# Patient Record
Sex: Female | Born: 1985 | Race: White | Hispanic: No | Marital: Married | Smoking: Former smoker
Health system: Southern US, Community
[De-identification: ages and names within clinical notes are randomized; demographics above are authoritative.]

## PROBLEM LIST (undated history)

## (undated) DIAGNOSIS — J351 Hypertrophy of tonsils: Secondary | ICD-10-CM

## (undated) HISTORY — PX: MOUTH SURGERY: SHX715

---

## 2011-02-09 ENCOUNTER — Ambulatory Visit: Payer: Self-pay | Admitting: Internal Medicine

## 2016-12-06 ENCOUNTER — Encounter: Payer: Self-pay | Admitting: *Deleted

## 2016-12-07 NOTE — Discharge Instructions (Signed)
T & A INSTRUCTION SHEET - [facility] °[facility] ° °CREIGHTON VAUGHT, MD °PAUL H. JUENGEL, MD  °P. SCOTT BENNETT °CHAPMAN MCQUEEN, MD ° °[address] [place], [place] [address] TEL. [phone] °[address] [place] [address] [phone] ° °INFORMATION SHEET FOR A TONSILLECTOMY AND ADENDOIDECTOMY ° °About Your Tonsils and Adenoids ° The tonsils and adenoids are normal body tissues that are part of our immune system.  They normally help to protect us against diseases that may enter our mouth and nose.  However, sometimes the tonsils and/or adenoids become too large and obstruct our breathing, especially at night. °  ° If either of these things happen it helps to remove the tonsils and adenoids in order to become healthier. The operation to remove the tonsils and adenoids is called a tonsillectomy and adenoidectomy. ° °The Location of Your Tonsils and Adenoids ° The tonsils are located in the back of the throat on both side and sit in a cradle of muscles. The adenoids are located in the roof of the mouth, behind the nose, and closely associated with the opening of the Eustachian tube to the ear. ° °Surgery on Tonsils and Adenoids ° A tonsillectomy and adenoidectomy is a short operation which takes about thirty minutes.  This includes being put to sleep and being awakened.  Tonsillectomies and adenoidectomies are performed at [facility] and may require observation period in the recovery room prior to going home. ° °Following the Operation for a Tonsillectomy ° A cautery machine is used to control bleeding.  Bleeding from a tonsillectomy and adenoidectomy is minimal and postoperatively the risk of bleeding is approximately four percent, although this rarely life threatening. ° ° ° °After your tonsillectomy and adenoidectomy post-op care at home: ° °1. Our patients are able to go home the same day.  You may be given prescriptions for pain  medications and antibiotics, if indicated. °2. It is extremely important to remember that fluid intake is of utmost importance after a tonsillectomy.  The amount that you drink must be maintained in the postoperative period.  A good indication of whether a child is getting enough fluid is whether his/her urine output is constant.  As long as children are urinating or wetting their diaper every 6 - 8 hours this is usually enough fluid intake.   °3. Although rare, this is a risk of some bleeding in the first ten days after surgery.  This is usually occurs between day five and nine postoperatively.  This risk of bleeding is approximately four percent.  If you or your child should have any bleeding you should remain calm and notify our office or go directly to the Emergency Room at [facility] where they will contact us. Our doctors are available seven days a week for notification.  We recommend sitting up quietly in a chair, place an ice pack on the front of the neck and spitting out the blood gently until we are able to contact you.  Adults should gargle gently with ice water and this may help stop the bleeding.  If the bleeding does not stop after a short time, i.e. 10 to 15 minutes, or seems to be increasing again, please contact us or go to the hospital.   °4. It is common for the pain to be worse at 5 - 7 days postoperatively.  This occurs because the “scab” is peeling off and the mucous membrane (skin of   the throat) is growing back where the tonsils were.   °5. It is common for a low-grade fever, less than 102, during the first week after a tonsillectomy and adenoidectomy.  It is usually due to not drinking enough liquids, and we suggest your use liquid Tylenol or the pain medicine with Tylenol prescribed in order to keep your temperature below 102.  Please follow the directions on the back of the bottle. °6. Do not take aspirin or any products that contain aspirin such as Bufferin, Anacin,  Ecotrin, aspirin gum, Goodies, BC headache powders, etc., after a T&A because it can promote bleeding.  Please check with our office before administering any other medication that may been prescribed by other doctors during the two week post-operative period. °7. If you happen to look in the mirror or into your child’s mouth you will see white/gray patches on the back of the throat.  This is what a scab looks like in the mouth and is normal after having a T&A.  It will disappear once the tonsil area heals completely. However, it may cause a noticeable odor, and this too will disappear with time.     °8. You or your child may experience ear pain after having a T&A.  This is called referred pain and comes from the throat, but it is felt in the ears.  Ear pain is quite common and expected.  It will usually go away after ten days.  There is usually nothing wrong with the ears, and it is primarily due to the healing area stimulating the nerve to the ear that runs along the side of the throat.  Use either the prescribed pain medicine or Tylenol as needed.  °9. The throat tissues after a tonsillectomy are obviously sensitive.  Smoking around children who have had a tonsillectomy significantly increases the risk of bleeding.  DO NOT SMOKE!  ° °General Anesthesia, Adult, Care After °These instructions provide you with information about caring for yourself after your procedure. Your health care provider may also give you more specific instructions. Your treatment has been planned according to current medical practices, but problems sometimes occur. Call your health care provider if you have any problems or questions after your procedure. °What can I expect after the procedure? °After the procedure, it is common to have: °· Vomiting. °· A sore throat. °· Mental slowness. ° °It is common to feel: °· Nauseous. °· Cold or shivery. °· Sleepy. °· Tired. °· Sore or achy, even in parts of your body where you did not have  surgery. ° °Follow these instructions at home: °For at least 24 hours after the procedure: °· Do not: °? Participate in activities where you could fall or become injured. °? Drive. °? Use heavy machinery. °? Drink alcohol. °? Take sleeping pills or medicines that cause drowsiness. °? Make important decisions or sign legal documents. °? Take care of children on your own. °· Rest. °Eating and drinking °· If you vomit, drink water, juice, or soup when you can drink without vomiting. °· Drink enough fluid to keep your urine clear or pale yellow. °· Make sure you have little or no nausea before eating solid foods. °· Follow the diet recommended by your health care provider. °General instructions °· Have a responsible adult stay with you until you are awake and alert. °· Return to your normal activities as told by your health care provider. Ask your health care provider what activities are safe for you. °· Take over-the-counter and   prescription medicines only as told by your health care provider. °· If you smoke, do not smoke without supervision. °· Keep all follow-up visits as told by your health care provider. This is important. °Contact a health care provider if: °· You continue to have nausea or vomiting at home, and medicines are not helpful. °· You cannot drink fluids or start eating again. °· You cannot urinate after 8-12 hours. °· You develop a skin rash. °· You have fever. °· You have increasing redness at the site of your procedure. °Get help right away if: °· You have difficulty breathing. °· You have chest pain. °· You have unexpected bleeding. °· You feel that you are having a life-threatening or urgent problem. °This information is not intended to replace advice given to you by your health care provider. Make sure you discuss any questions you have with your health care provider. °Document Released: 08/08/2000 Document Revised: 10/05/2015 Document Reviewed: 04/16/2015 °Elsevier Interactive Patient Education  © 2018 [employer] ° °

## 2016-12-13 ENCOUNTER — Ambulatory Visit
Admission: RE | Admit: 2016-12-13 | Discharge: 2016-12-13 | Disposition: A | Discharge status: Home / Self Care | Payer: BC Managed Care – PPO | Source: Ambulatory Visit | Attending: Otolaryngology | Admitting: Otolaryngology

## 2016-12-13 ENCOUNTER — Ambulatory Visit: Payer: BC Managed Care – PPO | Admitting: Anesthesiology

## 2016-12-13 ENCOUNTER — Encounter
Admission: RE | Disposition: A | Discharge status: Home / Self Care | Payer: Self-pay | Source: Ambulatory Visit | Attending: Otolaryngology

## 2016-12-13 DIAGNOSIS — J358 Other chronic diseases of tonsils and adenoids: Secondary | ICD-10-CM | POA: Insufficient documentation

## 2016-12-13 DIAGNOSIS — J3501 Chronic tonsillitis: Secondary | ICD-10-CM | POA: Insufficient documentation

## 2016-12-13 DIAGNOSIS — R0683 Snoring: Secondary | ICD-10-CM | POA: Insufficient documentation

## 2016-12-13 DIAGNOSIS — Z87891 Personal history of nicotine dependence: Secondary | ICD-10-CM | POA: Insufficient documentation

## 2016-12-13 DIAGNOSIS — J351 Hypertrophy of tonsils: Secondary | ICD-10-CM | POA: Diagnosis present

## 2016-12-13 HISTORY — DX: Hypertrophy of tonsils: J35.1

## 2016-12-13 HISTORY — PX: TONSILLECTOMY AND ADENOIDECTOMY: SHX28

## 2016-12-13 SURGERY — TONSILLECTOMY AND ADENOIDECTOMY
Anesthesia: General | Wound class: Clean Contaminated

## 2016-12-13 MED ORDER — PREDNISOLONE SODIUM PHOSPHATE 15 MG/5ML PO SOLN: ORAL | 0 refills | Status: DC

## 2016-12-13 MED ORDER — FENTANYL CITRATE (PF) 100 MCG/2ML IJ SOLN: 25.0000 ug | INTRAMUSCULAR | Status: DC | PRN

## 2016-12-13 MED ORDER — BUPIVACAINE-EPINEPHRINE (PF) 0.25% -1:200000 IJ SOLN
INTRAMUSCULAR | Status: DC | PRN
  Administered 2016-12-13: 3 mL

## 2016-12-13 MED ORDER — ONDANSETRON HCL 4 MG/2ML IJ SOLN
INTRAMUSCULAR | Status: DC | PRN
  Administered 2016-12-13: 4 mg via INTRAVENOUS

## 2016-12-13 MED ORDER — DEXAMETHASONE SODIUM PHOSPHATE 4 MG/ML IJ SOLN
INTRAMUSCULAR | Status: DC | PRN
  Administered 2016-12-13: 8 mg via INTRAVENOUS

## 2016-12-13 MED ORDER — ONDANSETRON HCL 4 MG/2ML IJ SOLN: 4.0000 mg | Freq: Once | INTRAMUSCULAR | Status: DC | PRN

## 2016-12-13 MED ORDER — LACTATED RINGERS IV SOLN
INTRAVENOUS | Status: DC
  Administered 2016-12-13: 09:00:00 via INTRAVENOUS

## 2016-12-13 MED ORDER — LIDOCAINE HCL (CARDIAC) 20 MG/ML IV SOLN
INTRAVENOUS | Status: DC | PRN
  Administered 2016-12-13: 50 mg via INTRAVENOUS

## 2016-12-13 MED ORDER — ACETAMINOPHEN 10 MG/ML IV SOLN
1000.0000 mg | Freq: Once | INTRAVENOUS | Status: DC | PRN
  Administered 2016-12-13: 1000 mg via INTRAVENOUS

## 2016-12-13 MED ORDER — FENTANYL CITRATE (PF) 100 MCG/2ML IJ SOLN
INTRAMUSCULAR | Status: DC | PRN
  Administered 2016-12-13: 100 ug via INTRAVENOUS

## 2016-12-13 MED ORDER — HYDROCODONE-ACETAMINOPHEN 7.5-325 MG/15ML PO SOLN: ORAL | 0 refills | Status: DC

## 2016-12-13 MED ORDER — OXYCODONE HCL 5 MG/5ML PO SOLN
5.0000 mg | Freq: Once | ORAL | Status: AC | PRN
  Administered 2016-12-13: 5 mg via ORAL

## 2016-12-13 MED ORDER — OXYCODONE HCL 5 MG PO TABS: 5.0000 mg | ORAL_TABLET | Freq: Once | ORAL | Status: AC | PRN

## 2016-12-13 MED ORDER — PROPOFOL 10 MG/ML IV BOLUS
INTRAVENOUS | Status: DC | PRN
  Administered 2016-12-13: 200 mg via INTRAVENOUS

## 2016-12-13 MED ORDER — MIDAZOLAM HCL 5 MG/5ML IJ SOLN
INTRAMUSCULAR | Status: DC | PRN
  Administered 2016-12-13: 2 mg via INTRAVENOUS

## 2016-12-13 MED ORDER — LACTATED RINGERS IV SOLN: INTRAVENOUS | Status: DC

## 2016-12-13 SURGICAL SUPPLY — 16 items
CANISTER SUCT 1200ML W/VALVE (MISCELLANEOUS) ×2 IMPLANT
CATH ROBINSON RED A/P 10FR (CATHETERS) ×2 IMPLANT
COAG SUCT 10F 3.5MM HAND CTRL (MISCELLANEOUS) ×2 IMPLANT
DECANTER SPIKE VIAL GLASS SM (MISCELLANEOUS) ×2 IMPLANT
ELECT CAUTERY BLADE TIP 2.5 (TIP) ×2
ELECTRODE CAUTERY BLDE TIP 2.5 (TIP) ×1 IMPLANT
GLOVE BIO SURGEON STRL SZ7.5 (GLOVE) ×2 IMPLANT
KIT ROOM TURNOVER OR (KITS) ×2 IMPLANT
NEEDLE HYPO 25GX1X1/2 BEV (NEEDLE) ×2 IMPLANT
NS IRRIG 500ML POUR BTL (IV SOLUTION) ×2 IMPLANT
PACK TONSIL/ADENOIDS (PACKS) ×2 IMPLANT
PAD GROUND ADULT SPLIT (MISCELLANEOUS) ×2 IMPLANT
PENCIL ELECTRO HAND CTR (MISCELLANEOUS) ×2 IMPLANT
SOL ANTI-FOG 6CC FOG-OUT (MISCELLANEOUS) ×1 IMPLANT
SOL FOG-OUT ANTI-FOG 6CC (MISCELLANEOUS) ×1
SYRINGE 10CC LL (SYRINGE) ×2 IMPLANT

## 2016-12-13 NOTE — H&P (Signed)
History and physical reviewed and will be scanned in later. No change in medical status reported by the patient or family, appears stable for surgery. All questions regarding the procedure answered, and patient (or family if a child) expressed understanding of the procedure.  Robi Mitter S @TODAY@ 

## 2016-12-13 NOTE — Transfer of Care (Signed)
Immediate Anesthesia Transfer of Care Note  Patient: Marie French  Procedure(s) Performed: Procedure(s): TONSILLECTOMY (N/A)  Patient Location: PACU  Anesthesia Type: General  Level of Consciousness: awake, alert  and patient cooperative  Airway and Oxygen Therapy: Patient Spontanous Breathing and Patient connected to supplemental oxygen  Post-op Assessment: Post-op Vital signs reviewed, Patient's Cardiovascular Status Stable, Respiratory Function Stable, Patent Airway and No signs of Nausea or vomiting  Post-op Vital Signs: Reviewed and stable  Complications: No apparent anesthesia complications

## 2016-12-13 NOTE — Anesthesia Procedure Notes (Signed)
Procedure Name: Intubation Date/Time: 12/13/2016 10:12 AM Performed by: Londell Moh Pre-anesthesia Checklist: Patient identified, Emergency Drugs available, Suction available, Patient being monitored and Timeout performed Patient Re-evaluated:Patient Re-evaluated prior to induction Oxygen Delivery Method: Circle system utilized Preoxygenation: Pre-oxygenation with 100% oxygen Induction Type: IV induction Ventilation: Mask ventilation without difficulty Laryngoscope Size: Mac and 3 Grade View: Grade I Tube type: Oral Rae Tube size: 6.5 mm Number of attempts: 1 Placement Confirmation: ETT inserted through vocal cords under direct vision,  positive ETCO2 and breath sounds checked- equal and bilateral Tube secured with: Tape Dental Injury: Teeth and Oropharynx as per pre-operative assessment

## 2016-12-13 NOTE — Op Note (Signed)
12/13/2016  10:33 AM    Marie DolinKimberly A Canales  [id number]   Pre-Op Diagnosis:  TONSIL HYPERTROPHY, TONSILLOLITHIASIS  Post-op Diagnosis: SAME  Procedure: Tonsillectomy  Surgeon:  Sandi MealyBennett, Barbie Croston S., MD  Anesthesia:  General endotracheal  EBL:  Less than 25 cc  Complications:  None  Findings: 3+ cryptic tonsils with tonsillith debris. No significant adenoid tissue in nasopharynx  Procedure: The patient was taken to the Operating Room and placed in the supine position.  After induction of general endotracheal anesthesia, the table was turned 90 degrees and the patient was draped in the usual fashion  with the eyes protected.  A mouth gag was inserted into the oral cavity to open the mouth, and examination of the oropharynx showed the uvula was non-bifid. The palate was palpated, and there was no evidence of submucous cleft. Examination of the nasopharynx showed no obstructing adenoids. The right tonsil was grasped with an Allis clamp and resected from the tonsillar fossa in the usual fashion with the Bovie. The left tonsil was resected in the same fashion. The Bovie was used to obtain hemostasis. Each tonsillar fossa was then carefully injected with 2% lidocaine with epinephrine, 1:200,000, avoiding intravascular injection. The nose and throat were irrigated and suctioned to remove any  blood clot. The mouth gag was  removed with no evidence of active bleeding.  The patient was then returned to the anesthesiologist for awakening, and was taken to the Recovery Room in stable condition.  Cultures:  None.  Specimens:  Tonsils.  Disposition:   PACU to home  Plan: Soft, bland diet and push fluids. Take pain medications, prednisolone and antibiotics as prescribed. No strenuous activity for 2 weeks. Follow-up in 3 weeks.  Sandi MealyBennett, Dymon Summerhill S 12/13/2016 10:33 AM

## 2016-12-13 NOTE — Anesthesia Preprocedure Evaluation (Signed)
Anesthesia Evaluation  Patient identified by MRN, date of birth, ID band Patient awake    Reviewed: Allergy & Precautions, NPO status , Patient's Chart, lab work & pertinent test results  History of Anesthesia Complications Negative for: history of anesthetic complications  Airway Mallampati: I  TM Distance: >3 FB Neck ROM: Full    Dental no notable dental hx. (+) Teeth Intact   Pulmonary former smoker (quit 06/2015),  Snoring    Pulmonary exam normal breath sounds clear to auscultation       Cardiovascular Exercise Tolerance: Good negative cardio ROS Normal cardiovascular exam Rhythm:Regular Rate:Normal     Neuro/Psych negative neurological ROS     GI/Hepatic negative GI ROS,   Endo/Other  negative endocrine ROS  Renal/GU negative Renal ROS     Musculoskeletal   Abdominal   Peds  Hematology negative hematology ROS (+)   Anesthesia Other Findings Tonsillar hypertrophy  Reproductive/Obstetrics                             Anesthesia Physical Anesthesia Plan  ASA: I  Anesthesia Plan: General   Post-op Pain Management:    Induction: Intravenous  PONV Risk Score and Plan: 2 and Ondansetron and Dexamethasone  Airway Management Planned: Oral ETT  Additional Equipment:   Intra-op Plan:   Post-operative Plan: Extubation in OR  Informed Consent: I have reviewed the patients History and Physical, chart, labs and discussed the procedure including the risks, benefits and alternatives for the proposed anesthesia with the patient or authorized representative who has indicated his/her understanding and acceptance.     Plan Discussed with: CRNA  Anesthesia Plan Comments:         Anesthesia Quick Evaluation

## 2016-12-13 NOTE — Anesthesia Postprocedure Evaluation (Signed)
Anesthesia Post Note  Patient: Linden DolinKimberly A Canales  Procedure(s) Performed: Procedure(s) (LRB): TONSILLECTOMY (N/A)  Patient location during evaluation: PACU Anesthesia Type: General Level of consciousness: awake and alert, oriented and patient cooperative Pain management: pain level controlled Vital Signs Assessment: post-procedure vital signs reviewed and stable Respiratory status: spontaneous breathing, nonlabored ventilation and respiratory function stable Cardiovascular status: blood pressure returned to baseline and stable Postop Assessment: adequate PO intake Anesthetic complications: no    Reed BreechAndrea Ameriah Lint

## 2016-12-15 LAB — SURGICAL PATHOLOGY

## 2019-12-11 ENCOUNTER — Encounter: Payer: 59 | Admitting: Advanced Practice Midwife

## 2019-12-26 ENCOUNTER — Other Ambulatory Visit: Payer: Self-pay

## 2019-12-26 ENCOUNTER — Encounter: Payer: Self-pay | Admitting: Obstetrics and Gynecology

## 2019-12-26 ENCOUNTER — Other Ambulatory Visit (HOSPITAL_COMMUNITY)
Admission: RE | Admit: 2019-12-26 | Discharge: 2019-12-26 | Disposition: A | Discharge status: Home / Self Care | Payer: 59 | Source: Ambulatory Visit | Attending: Obstetrics and Gynecology | Admitting: Obstetrics and Gynecology

## 2019-12-26 ENCOUNTER — Ambulatory Visit (INDEPENDENT_AMBULATORY_CARE_PROVIDER_SITE_OTHER): Payer: 59 | Admitting: Obstetrics and Gynecology

## 2019-12-26 VITALS — BP 126/79 | HR 79 | Ht 59.0 in | Wt 112.0 lb

## 2019-12-26 DIAGNOSIS — Z1339 Encounter for screening examination for other mental health and behavioral disorders: Secondary | ICD-10-CM

## 2019-12-26 DIAGNOSIS — Z113 Encounter for screening for infections with a predominantly sexual mode of transmission: Secondary | ICD-10-CM | POA: Insufficient documentation

## 2019-12-26 DIAGNOSIS — Z1331 Encounter for screening for depression: Secondary | ICD-10-CM | POA: Diagnosis not present

## 2019-12-26 DIAGNOSIS — Z124 Encounter for screening for malignant neoplasm of cervix: Secondary | ICD-10-CM | POA: Insufficient documentation

## 2019-12-26 DIAGNOSIS — Z01419 Encounter for gynecological examination (general) (routine) without abnormal findings: Secondary | ICD-10-CM | POA: Diagnosis not present

## 2019-12-26 NOTE — Progress Notes (Signed)
Gynecology Annual Exam  PCP: Patient, No Pcp Per Laddie Aquas, NP at [facility] Complaint  Patient presents with  . Gynecologic Exam    discuss HPV S&S   History of Present Illness:  [name] is a 34 y.o. G0P0000 who LMP was Patient's last menstrual period was 12/17/2019., presents today for her annual examination.  Her menses are regular every 28-30 days, lasting 3 day(s).  Dysmenorrhea none. She occasionally does have intermenstrual spotting.  She is sexually active. She uses combined OCPs.  Last Pap: several years ago  Results were: no abnormalities /neg HPV DNA negative Hx of STDs: HPV, HSV  There is a FH of breast cancer MGM at an older age (?86s-50s). There is no FH of ovarian cancer. The patient does do self-breast exams.  Tobacco use: former smoker, quit about 5 years ago (2016) Alcohol use: social drinker Exercise: very active  The patient wears seatbelts: yes.   The patient reports that domestic violence in her life is absent.   She states that she has genital warts and so does her fiance.  She believes they have been passing it back and forth. These have been present for a couple of years. They both have been to a dermatologist and have had them frozen and have used Aldera.    Past Medical History:  Diagnosis Date  . Hypertrophy of tonsils     Past Surgical History:  Procedure Laterality Date  . MOUTH SURGERY     gum graft. conscious sedation  . TONSILLECTOMY AND ADENOIDECTOMY N/A 12/13/2016   Procedure: TONSILLECTOMY;  Surgeon: Geanie Logan, MD;  [facility];  Service: ENT;  Laterality: N/A;    Prior to Admission medications   Medication Sig Start Date End Date Taking? Authorizing Provider  Norgestim-Eth Estrad Triphasic (TRI-SPRINTEC PO) Take by mouth daily.   Yes [provider]    Allergies  Allergen Reactions  . Penicillins Anaphylaxis   Obstetric History: G0P0000  Social History   Socioeconomic History    . Marital status: Single    Spouse name: Not on file  . Number of children: Not on file  . Years of education: Not on file  . Highest education level: Not on file  Occupational History  . Not on file  Tobacco Use  . Smoking status: Former Smoker    Quit date: 06/17/2015    Years since quitting: 4.5  . Smokeless tobacco: Never Used  Vaping Use  . Vaping Use: Never used  Substance and Sexual Activity  . Alcohol use: Yes    Alcohol/week: 3.0 standard drinks    Types: 3 Glasses of wine per week  . Drug use: Never  . Sexual activity: Yes    Birth control/protection: Pill  Other Topics Concern  . Not on file  Social History Narrative  . Not on file   Social Determinants of Health   Financial Resource Strain:   . Difficulty of Paying Living Expenses:   Food Insecurity:   . Worried About Programme researcher, broadcasting/film/video in the Last Year:   . Barista in the Last Year:   Transportation Needs:   . Freight forwarder (Medical):   Marland Kitchen Lack of Transportation (Non-Medical):   Physical Activity:   . Days of Exercise per Week:   . Minutes of Exercise per Session:   Stress:   . Feeling of Stress :   Social Connections:   . Frequency of Communication with Friends and Family:   .  Frequency of Social Gatherings with Friends and Family:   . Attends Religious Services:   . Active Member of Clubs or Organizations:   . Attends Banker Meetings:   Marland Kitchen Marital Status:   Intimate Partner Violence:   . Fear of Current or Ex-Partner:   . Emotionally Abused:   Marland Kitchen Physically Abused:   . Sexually Abused:     Family History  Problem Relation Age of Onset  . Breast cancer Maternal Grandmother   . Diabetes Father   . Hypertension Mother   . Diabetes Sister   . Hypertension Sister   . Hypercholesterolemia Sister   . Ovarian cancer Neg Hx   . Colon cancer Neg Hx     Review of Systems  Constitutional: Negative.   HENT: Negative.   Eyes: Negative.   Respiratory: Negative.    Cardiovascular: Negative.   Gastrointestinal: Negative.   Genitourinary: Negative.   Musculoskeletal: Negative.   Skin: Negative.   Neurological: Negative.   Psychiatric/Behavioral: Negative.      Physical Exam BP 126/79   Pulse 79   Ht 4\' 11"  (1.499 m)   Wt 112 lb (50.8 kg)   LMP 12/17/2019   BMI 22.62 kg/m    Physical Exam Constitutional:      General: She is not in acute distress.    Appearance: Normal appearance. She is well-developed.  Genitourinary:     Pelvic exam was performed with patient in the lithotomy position.     Vulva, urethra, bladder and uterus normal.     No inguinal adenopathy present in the right or left side.       No signs of injury in the vagina.     No vaginal discharge, erythema, tenderness or bleeding.     No cervical motion tenderness, discharge, lesion or polyp.     Uterus is mobile.     Uterus is not enlarged or tender.     No uterine mass detected.    Uterus is anteverted.     No right or left adnexal mass present.     Right adnexa not tender or full.     Left adnexa not tender or full.  HENT:     Head: Normocephalic and atraumatic.  Eyes:     General: No scleral icterus.    Conjunctiva/sclera: Conjunctivae normal.  Neck:     Thyroid: No thyromegaly.  Cardiovascular:     Rate and Rhythm: Normal rate and regular rhythm.     Heart sounds: No murmur heard.  No friction rub. No gallop.   Pulmonary:     Effort: Pulmonary effort is normal. No respiratory distress.     Breath sounds: Normal breath sounds. No wheezing or rales.  Chest:     Breasts:        Right: No inverted nipple, mass, nipple discharge, skin change or tenderness.        Left: No inverted nipple, mass, nipple discharge, skin change or tenderness.  Abdominal:     General: Bowel sounds are normal. There is no distension.     Palpations: Abdomen is soft. There is no mass.     Tenderness: There is no abdominal tenderness. There is no guarding or rebound.   Musculoskeletal:        General: No swelling or tenderness. Normal range of motion.     Cervical back: Normal range of motion and neck supple.  Lymphadenopathy:     Cervical: No cervical adenopathy.     Lower Body: No  right inguinal adenopathy. No left inguinal adenopathy.  Neurological:     General: No focal deficit present.     Mental Status: She is alert and oriented to person, place, and time.     Cranial Nerves: No cranial nerve deficit.  Skin:    General: Skin is warm and dry.     Findings: No erythema or rash.  Psychiatric:        Mood and Affect: Mood normal.        Behavior: Behavior normal.        Judgment: Judgment normal.    Female chaperone present for pelvic and breast  portions of the physical exam  Results: AUDIT Questionnaire (screen for alcoholism): 3 PHQ-9: 2 GAD-7: 2   Assessment: 34 y.o. G0P0000 female here for routine annual gynecologic examination  Plan: Problem List Items Addressed This Visit    None    Visit Diagnoses    Women's annual routine gynecological examination    -  Primary   Relevant Orders   Cytology - PAP   Screening for depression       Screening for alcoholism       Pap smear for cervical cancer screening       Relevant Orders   Cytology - PAP   Screen for STD (sexually transmitted disease)       Relevant Orders   Cytology - PAP      Screening: -- Blood pressure screen normal -- Weight screening: normal -- Depression screening negative (PHQ-9) -- Nutrition: normal -- cholesterol screening: not due for screening -- osteoporosis screening: not due -- tobacco screening: not using -- alcohol screening: AUDIT questionnaire indicates low-risk usage. -- family history of breast cancer screening: done. not at high risk. -- no evidence of domestic violence or intimate partner violence. -- STD screening: gonorrhea/chlamydia NAAT collected -- pap smear collected per ASCCP guidelines -- HPV vaccination series: received --  received COVID19 vaccine, Moderna   Combined OCPs refilled by PCP. She declines refills at this time. Discussed HPV. She is an NP. 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 No lesions noted today.   Thomasene Mohair, MD 12/26/2019 10:28 AM

## 2019-12-27 ENCOUNTER — Encounter: Payer: Self-pay | Admitting: Obstetrics and Gynecology

## 2019-12-27 LAB — CYTOLOGY - PAP
Chlamydia: NEGATIVE
Comment: NEGATIVE
Comment: NEGATIVE
Comment: NEGATIVE
Comment: NORMAL
Diagnosis: NEGATIVE
High risk HPV: NEGATIVE
Neisseria Gonorrhea: NEGATIVE
Trichomonas: NEGATIVE

## 2021-03-09 ENCOUNTER — Ambulatory Visit (INDEPENDENT_AMBULATORY_CARE_PROVIDER_SITE_OTHER): Payer: 59 | Admitting: Obstetrics

## 2021-03-09 ENCOUNTER — Other Ambulatory Visit (HOSPITAL_COMMUNITY)
Admission: RE | Admit: 2021-03-09 | Discharge: 2021-03-09 | Disposition: A | Discharge status: Home / Self Care | Payer: 59 | Source: Ambulatory Visit | Attending: Obstetrics | Admitting: Obstetrics

## 2021-03-09 ENCOUNTER — Other Ambulatory Visit: Payer: Self-pay

## 2021-03-09 ENCOUNTER — Encounter: Payer: Self-pay | Admitting: Obstetrics

## 2021-03-09 VITALS — BP 108/70 | Ht 59.0 in | Wt 113.2 lb

## 2021-03-09 DIAGNOSIS — N912 Amenorrhea, unspecified: Secondary | ICD-10-CM | POA: Diagnosis not present

## 2021-03-09 DIAGNOSIS — Z3490 Encounter for supervision of normal pregnancy, unspecified, unspecified trimester: Secondary | ICD-10-CM | POA: Insufficient documentation

## 2021-03-09 DIAGNOSIS — Z124 Encounter for screening for malignant neoplasm of cervix: Secondary | ICD-10-CM | POA: Insufficient documentation

## 2021-03-09 DIAGNOSIS — Z113 Encounter for screening for infections with a predominantly sexual mode of transmission: Secondary | ICD-10-CM | POA: Diagnosis not present

## 2021-03-09 DIAGNOSIS — Z3480 Encounter for supervision of other normal pregnancy, unspecified trimester: Secondary | ICD-10-CM

## 2021-03-09 DIAGNOSIS — N926 Irregular menstruation, unspecified: Secondary | ICD-10-CM | POA: Diagnosis not present

## 2021-03-09 LAB — POCT URINALYSIS DIPSTICK OB
Glucose, UA: NEGATIVE
POC,PROTEIN,UA: NEGATIVE

## 2021-03-09 LAB — POCT URINE PREGNANCY: Preg Test, Ur: POSITIVE — AB

## 2021-03-09 NOTE — Progress Notes (Signed)
New Obstetric Patient H&P    Chief Complaint: "Desires prenatal care"   History of Present Illness: Patient is a 35 y.o. G1P0000 Not Hispanic or Latino female, LMP approximately 01/14/2021 presents with amenorrhea and positive home pregnancy test. Based on her  LMP, her EDD is Estimated Date of Delivery: 10/21/21 and her EGA is [redacted]w[redacted]d. Cycles are 5. days, regular, and occur approximately every : 28 days. Her last pap smear was 1 years ago and was no abnormalities.    She had a urine pregnancy test which was positive about 3.5 week(s)  ago. Her last menstrual period was normal and lasted for  about 5 day(s). Since her LMP she claims she has experienced breast sensitivity and fatigue. She denies vaginal bleeding. Her past medical history is noncontributory. Her prior pregnancies are notable for none  Since her LMP, she admits to the use of tobacco products  no She claims she has gained   no pounds since the start of her pregnancy.  There are cats in the home in the home  yes If yes Outdoor She admits close contact with children on a regular basis  no  She has had chicken pox in the past yes She has had Tuberculosis exposures, symptoms, or previously tested positive for TB   no Current or past history of domestic violence. no  Genetic Screening/Teratology Counseling: (Includes patient, baby's father, or anyone in either family with:)   1. Patient's age >/= 24 [facility]  yes 2. Thalassemia ([place], [place], Mediterranean, or Asian background): MCV<80  no 3. Neural tube defect (meningomyelocele, spina bifida, anencephaly)  no 4. Congenital heart defect  no  5. Down syndrome  no 6. Tay-Sachs (Jewish, [place]  no 7. Canavan's Disease  no 8. Sickle cell disease or trait (African)  no  9. Hemophilia or other blood disorders  no  10. Muscular dystrophy  no  11. Cystic fibrosis  no  12. Huntington's Chorea  no  13. Mental retardation/autism  no 14. Other inherited genetic or  chromosomal disorder  no 15. Maternal metabolic disorder (DM, PKU, etc)  no 16. Patient or FOB with a child with a birth defect not listed above no  16a. Patient or FOB with a birth defect themselves no 17. Recurrent pregnancy loss, or stillbirth  no  18. Any medications since LMP other than prenatal vitamins (include vitamins, supplements, OTC meds, drugs, alcohol)  no 19. Any other genetic/environmental exposure to discuss  no  Infection History:   1. Lives with someone with TB or TB exposed  no  2. Patient or partner has history of genital herpes  yes 3. Rash or viral illness since LMP  no 4. History of STI (GC, CT, HPV, syphilis, HIV)  Yes- HPV, codylomata 5. History of recent travel :  no  Other pertinent information:  no     Review of Systems:10 point review of systems negative unless otherwise noted in HPI  Past Medical History:  Past Medical History:  Diagnosis Date  . Hypertrophy of tonsils     Past Surgical History:  Past Surgical History:  Procedure Laterality Date  . MOUTH SURGERY     gum graft. conscious sedation  . TONSILLECTOMY AND ADENOIDECTOMY N/A 12/13/2016   Procedure: TONSILLECTOMY;  Surgeon: Geanie Logan, MD;  Location: [facility];  Service: ENT;  Laterality: N/A;    Gynecologic History: Patient's last menstrual period was 01/14/2021 (approximate).  Obstetric History: G1P0000  Family History:  Family  History  Problem Relation Age of Onset  . Breast cancer Maternal Grandmother   . Diabetes Father   . Hypertension Mother   . Diabetes Sister   . Hypertension Sister   . Hypercholesterolemia Sister   . Ovarian cancer Neg Hx   . Colon cancer Neg Hx     Social History:  Social History   Socioeconomic History  . Marital status: Married    Spouse name: Not on file  . Number of children: Not on file  . Years of education: Not on file  . Highest education level: Not on file  Occupational History  . Not on file  Tobacco Use  .  Smoking status: Former    Types: Cigarettes    Quit date: 06/17/2015    Years since quitting: 5.7  . Smokeless tobacco: Never  Vaping Use  . Vaping Use: Never used  Substance and Sexual Activity  . Alcohol use: Yes    Alcohol/week: 3.0 standard drinks    Types: 3 Glasses of wine per week  . Drug use: Never  . Sexual activity: Yes    Birth control/protection: Pill  Other Topics Concern  . Not on file  Social History Narrative  . Not on file   Social Determinants of Health   Financial Resource Strain: Not on file  Food Insecurity: Not on file  Transportation Needs: Not on file  Physical Activity: Not on file  Stress: Not on file  Social Connections: Not on file  Intimate Partner Violence: Not on file    Allergies:  Allergies  Allergen Reactions  . Penicillins Anaphylaxis    Medications: Prior to Admission medications   Medication Sig Start Date End Date Taking? Authorizing Provider  Prenatal Vit-Fe Fumarate-FA (MULTIVITAMIN-PRENATAL) 27-0.8 MG TABS tablet Take 1 tablet by mouth daily at 12 noon.   Yes [provider]    Physical Exam Vitals: Blood pressure 108/70, height 4\' 11"  (1.499 m), weight 113 lb 3.2 oz (51.3 kg), last menstrual period 01/14/2021.  General: NAD HEENT: normocephalic, anicteric Thyroid: no enlargement, no palpable nodules Pulmonary: No increased work of breathing, CTAB Cardiovascular: RRR, distal pulses 2+ Abdomen: NABS, soft, non-tender, non-distended.  Umbilicus without lesions.  No hepatomegaly, splenomegaly or masses palpable. No evidence of hernia  Genitourinary:  External: Normal external female genitalia.  Normal urethral meatus, normal  Bartholin's and Skene's glands.    Vagina: Normal vaginal mucosa, no evidence of prolapse.    Cervix: Grossly normal in appearance, no bleeding  Uterus: anteverted, Non-enlarged, mobile, normal contour.  No CMT  Adnexa: ovaries non-enlarged, no adnexal masses  Rectal: deferred Extremities: no  edema, erythema, or tenderness Neurologic: Grossly intact Psychiatric: mood appropriate, affect full  Pap smear and cultures today. She will have her blood work at the next visit in 2-3 weeks. Discussed genetic testing, and they do want the Inheritesting and the MaternT. She is considered a Mature 03/16/2021.   Assessment: 35 y.o. G1P0000 at [redacted]w[redacted]d presenting to initiate prenatal care  Plan: 1) Avoid alcoholic beverages. 2) Patient encouraged not to smoke.  3) Discontinue the use of all non-medicinal drugs and chemicals.  4) Take prenatal vitamins daily.  5) Nutrition, food safety (fish, cheese advisories, and high nitrite foods) and exercise discussed. 6) Hospital and practice style discussed with cross coverage system.  7) Genetic Screening, such as with 1st Trimester Screening, cell free fetal DNA, AFP testing, and Ultrasound, as well as with amniocentesis and CVS as appropriate, is discussed with patient. At the conclusion of  today's visit patient requested genetic testing 8) Patient is asked about travel to areas at risk for the Zika virus, and counseled to avoid travel and exposure to mosquitoes or sexual partners who may have themselves been exposed to the virus. Testing is discussed, and will be ordered as appropriate.  The following were addressed during this visit:  Breastfeeding Education - Early initiation of breastfeeding    Comments: Keeps milk supply adequate, helps contract uterus and slow bleeding, and early milk is the perfect first food and is easy to digest.   - The importance of exclusive breastfeeding    Comments: Provides antibodies, Lower risk of breast and ovarian cancers, and type-2 diabetes,Helps your body recover, Reduced chance of SIDS.   - Risks of giving your baby anything other than breast milk if you are breastfeeding    Comments: Make the baby less content with breastfeeds, may make my baby more susceptible to illness, and may reduce my milk supply.   -  Nonpharmacological pain relief methods for labor    Comments: Deep breathing, focusing on pleasant things, movement and walking, heating pads or cold compress, massage and relaxation, continuous support from someone you trust, and Doulas   - The importance of early skin-to-skin contact    Comments: Keeps baby warm and secure, helps keep baby's blood sugar up and breathing steady, easier to bond and breastfeed, and helps calm baby.  - Rooming-in on a 24-hour basis    Comments: Easier to learn baby's feeding cues, easier to bond and get to know each other, and encourages milk production.   - Feeding on demand or baby-led feeding    Comments: Helps prevent breastfeeding complications, helps bring in good milk supply, prevents under or overfeeding, and helps baby feel content and satisfied   - Frequent feeding to help assure optimal milk production    Comments: Making a full supply of milk requires frequent removal of milk from breasts, infant will eat 8-12 times in 24 hours, if separated from infant use breast massage, hand expression and/ or pumping to remove milk from breasts.   - Effective positioning and attachment    Comments: Helps my baby to get enough breast milk, helps to produce an adequate milk supply, and helps prevent nipple pain and damage   - Exclusive breastfeeding for the first 6 months    Comments: Builds a healthy milk supply and keeps it up, protects baby from sickness and disease, and breastmilk has everything your baby needs for the first 6 months.  - Individualized Education    Comments: Contraindications to breastfeeding and other special medical conditions   Mirna Mires, CNM  03/09/2021 5:10 PM

## 2021-03-12 LAB — CYTOLOGY - PAP
Chlamydia: NEGATIVE
Comment: NEGATIVE
Comment: NEGATIVE
Comment: NEGATIVE
Comment: NORMAL
Diagnosis: NEGATIVE
High risk HPV: NEGATIVE
Neisseria Gonorrhea: NEGATIVE
Trichomonas: NEGATIVE

## 2021-03-14 LAB — URINE CULTURE: Organism ID, Bacteria: NO GROWTH

## 2021-03-31 ENCOUNTER — Encounter: Payer: Self-pay | Admitting: Obstetrics and Gynecology

## 2021-03-31 ENCOUNTER — Other Ambulatory Visit: Payer: Self-pay

## 2021-03-31 ENCOUNTER — Ambulatory Visit (INDEPENDENT_AMBULATORY_CARE_PROVIDER_SITE_OTHER): Payer: 59 | Admitting: Obstetrics and Gynecology

## 2021-03-31 VITALS — BP 118/70 | Wt 115.0 lb

## 2021-03-31 DIAGNOSIS — Z23 Encounter for immunization: Secondary | ICD-10-CM | POA: Diagnosis not present

## 2021-03-31 DIAGNOSIS — Z3A11 11 weeks gestation of pregnancy: Secondary | ICD-10-CM

## 2021-03-31 DIAGNOSIS — Z3480 Encounter for supervision of other normal pregnancy, unspecified trimester: Secondary | ICD-10-CM

## 2021-03-31 DIAGNOSIS — Z1379 Encounter for other screening for genetic and chromosomal anomalies: Secondary | ICD-10-CM

## 2021-03-31 DIAGNOSIS — O09511 Supervision of elderly primigravida, first trimester: Secondary | ICD-10-CM

## 2021-03-31 DIAGNOSIS — O09519 Supervision of elderly primigravida, unspecified trimester: Secondary | ICD-10-CM | POA: Insufficient documentation

## 2021-03-31 NOTE — Progress Notes (Signed)
  Routine Prenatal Care Visit  Subjective  Marie French is a 35 y.o. G1P0000 at [redacted]w[redacted]d being seen today for ongoing prenatal care.  She is currently monitored for the following issues for this low-risk pregnancy and has Supervision of other normal pregnancy, antepartum and Advanced maternal age, 1st pregnancy on their problem list.  ----------------------------------------------------------------------------------- Patient reports no complaints.    . Vag. Bleeding: None.   . Leaking Fluid denies.  ----------------------------------------------------------------------------------- The following portions of the patient's history were reviewed and updated as appropriate: allergies, current medications, past family history, past medical history, past social history, past surgical history and problem list. Problem list updated.  Objective  Blood pressure 118/70, weight 115 lb (52.2 kg), last menstrual period 01/14/2021. Pregravid weight 113 lb (51.3 kg) Total Weight Gain 2 lb (0.907 kg) Urinalysis: Urine Protein    Urine Glucose    Fetal Status: Fetal Heart Rate (bpm): 168         General:  Alert, oriented and cooperative. Patient is in no acute distress.  Skin: Skin is warm and dry. No rash noted.   Cardiovascular: Normal heart rate noted  Respiratory: Normal respiratory effort, no problems with respiration noted  Abdomen: Soft, gravid, appropriate for gestational age.       Pelvic:  Cervical exam deferred        Extremities: Normal range of motion.  Edema: None  Mental Status: Normal mood and affect. Normal behavior. Normal judgment and thought content.   Assessment   35 y.o. G1P0000 at 104w1d by  10/19/2021, by Ultrasound presenting for routine prenatal visit  Plan   [email or web address] Problems (from 03/09/21 to present)     Problem Noted Resolved   Advanced maternal age, 1st pregnancy 03/31/2021 by Conard Novak, MD No   Supervision of other normal pregnancy, antepartum  03/09/2021 by Mirna Mires, CNM No   Overview Addendum 03/31/2021  2:46 PM by Conard Novak, MD     Nursing Staff Provider  Office Location  [facility] Dating  11 wk u/s  Language  English Anatomy US    Flu Vaccine   Genetic Screen  NIPS:   TDaP vaccine    Hgb A1C or  GTT Early : Third trimester :   Covid    LAB RESULTS   Rhogam   Blood Type     Feeding Plan  Antibody    Contraception  Rubella    Circumcision  RPR     Pediatrician   HBsAg     Support Person  HIV    Prenatal Classes  Varicella     GBS  (For PCN allergy, check sensitivities)   BTL Consent     VBAC Consent  Pap  NILM    Hgb Electro      CF      SMA                   Preterm labor symptoms and general obstetric precautions including but not limited to vaginal bleeding, contractions, leaking of fluid and fetal movement were reviewed in detail with the patient. Please refer to After Visit Summary for other counseling recommendations.   - NOB labs today with NIPT/inheritest   Return in about 4 weeks (around 04/28/2021) for ROB.   Thomasene Mohair, MD, [facility], [facility] 03/31/2021 2:47 PM

## 2021-03-31 NOTE — Progress Notes (Signed)
ULTRASOUND REPORT  Location: [facility] Date of Service: 03/31/2021   Indications:Unsure LMP (transabdominal) Findings:  Singleton intrauterine pregnancy is visualized with a CRL consistent with [redacted]w[redacted]d gestation, giving an (U/S) EDD of 10/19/2021. The patient had an unsure LMP. So, this is the basis for her pregnancy dating.  FHR: 168 BPM CRL measurement: 4.28 cm Yolk sac is visualized and appears normal. Amnion: not visualized   Right Ovary is not visualized. Left Ovary is normal appearance. Corpus luteal cyst:  Left ovary Survey of the adnexa demonstrates no adnexal masses. There is no free peritoneal fluid in the cul de sac.  Impression: 1. [redacted]w[redacted]d Viable Singleton Intrauterine pregnancy by U/S. 2. (U/S) EDD is consistent with Clinically established EDD of 10/19/2021.  There is a viable singleton gestation.  Detailed evaluation of the fetal anatomy is precluded by early gestational age.  It must be noted that a normal ultrasound particular at this early gestational age is unable to rule out fetal aneuploidy, risk of first trimester miscarriage, or anatomic birth defects.  I personally performed the ultrasound and interpreted the images.   Thomasene Mohair, MD, [facility], [facility] 03/31/2021 2:50 PM

## 2021-04-01 LAB — RPR+RH+ABO+RUB AB+AB SCR+CB...
Antibody Screen: NEGATIVE
HIV Screen 4th Generation wRfx: NONREACTIVE
Hematocrit: 34.7 % (ref 34.0–46.6)
Hemoglobin: 11.6 g/dL (ref 11.1–15.9)
Hepatitis B Surface Ag: NEGATIVE
MCH: 29.4 pg (ref 26.6–33.0)
MCHC: 33.4 g/dL (ref 31.5–35.7)
MCV: 88 fL (ref 79–97)
Platelets: 335 10*3/uL (ref 150–450)
RBC: 3.95 x10E6/uL (ref 3.77–5.28)
RDW: 12 % (ref 11.7–15.4)
RPR Ser Ql: NONREACTIVE
Rh Factor: POSITIVE
Rubella Antibodies, IGG: 1.32 index (ref >0.99)
Varicella zoster IgG: 546 index (ref >165)
WBC: 9.3 10*3/uL (ref 3.4–10.8)

## 2021-04-01 LAB — HEPATITIS C ANTIBODY: Hep C Virus Ab: <0.1 s/co ratio (ref 0.0–0.9)

## 2021-04-06 LAB — MATERNIT 21 PLUS CORE, BLOOD
Fetal Fraction: 6
Result (T21): NEGATIVE
Trisomy 13 (Patau syndrome): NEGATIVE
Trisomy 18 (Edwards syndrome): NEGATIVE
Trisomy 21 (Down syndrome): NEGATIVE

## 2021-04-08 LAB — INHERITEST CORE(CF97,SMA,FRAX)

## 2021-04-28 ENCOUNTER — Ambulatory Visit (INDEPENDENT_AMBULATORY_CARE_PROVIDER_SITE_OTHER): Payer: 59 | Admitting: Licensed Practical Nurse

## 2021-04-28 ENCOUNTER — Other Ambulatory Visit: Payer: Self-pay

## 2021-04-28 VITALS — BP 110/72 | Wt 120.0 lb

## 2021-04-28 DIAGNOSIS — Z3480 Encounter for supervision of other normal pregnancy, unspecified trimester: Secondary | ICD-10-CM

## 2021-04-28 DIAGNOSIS — Z3A15 15 weeks gestation of pregnancy: Secondary | ICD-10-CM

## 2021-04-28 NOTE — Progress Notes (Signed)
No vb. No lof.  

## 2021-04-28 NOTE — Progress Notes (Signed)
°  Routine Prenatal Care Visit  Subjective  Marie French is a 35 y.o. G1P0000 at [redacted]w[redacted]d being seen today for ongoing prenatal care.  She is currently monitored for the following issues for this low-risk pregnancy and has Supervision of other normal pregnancy, antepartum and Advanced maternal age, 1st pregnancy on their problem list.  ----------------------------------------------------------------------------------- Patient reports no complaints.  Here with [place], feeling good   .  .   . Leaking Fluid denies.  ----------------------------------------------------------------------------------- The following portions of the patient's history were reviewed and updated as appropriate: allergies, current medications, past family history, past medical history, past social history, past surgical history and problem list. Problem list updated.  Objective  Blood pressure 110/72, weight 120 lb (54.4 kg), last menstrual period 01/14/2021. Pregravid weight 113 lb (51.3 kg) Total Weight Gain 7 lb (3.175 kg) Urinalysis: Urine Protein    Urine Glucose    Fetal Status:           General:  Alert, oriented and cooperative. Patient is in no acute distress.  Skin: Skin is warm and dry. No rash noted.   Cardiovascular: Normal heart rate noted  Respiratory: Normal respiratory effort, no problems with respiration noted  Abdomen: Soft, gravid, appropriate for gestational age.       Pelvic:  Cervical exam deferred        Extremities: Normal range of motion.     Mental Status: Normal mood and affect. Normal behavior. Normal judgment and thought content.   Assessment   35 y.o. G1P0000 at [redacted]w[redacted]d by  10/19/2021, by Ultrasound presenting for routine prenatal visit  Plan   [email or web address] Problems (from 03/09/21 to present)    Problem Noted Resolved   Advanced maternal age, 1st pregnancy 03/31/2021 by Conard Novak, MD No   Supervision of other normal pregnancy, antepartum 03/09/2021 by Mirna Mires,  CNM No   Overview Addendum 04/28/2021  3:17 PM by Ellwood Sayers, CNM     Nursing Staff Provider  Office Location  [facility] Dating  11 wk u/s  Language  English Anatomy US    Flu Vaccine  03/31/2021 Genetic Screen  NIPS:   TDaP vaccine    Hgb A1C or  GTT Early : Third trimester :   Covid    LAB RESULTS   Rhogam   Blood Type   A+  Feeding Plan breast Antibody  neg  Contraception  Rubella  immune  Circumcision  RPR   NR  Pediatrician   HBsAg   neg  Support Person [place] HIV  Neg  Prenatal Classes  Varicella immune    GBS  (For PCN allergy, check sensitivities)   BTL Consent     VBAC Consent  Pap  NILM    Hgb Electro      CF      SMA                   Preterm labor symptoms and general obstetric precautions including but not limited to vaginal bleeding, contractions, leaking of fluid and fetal movement were reviewed in detail with the patient. Please refer to After Visit Summary for other counseling recommendations.   Keep next appointment   Carie Caddy, CNM  [facility], [facility]  04/28/21  3:19 PM

## 2021-05-16 NOTE — L&D Delivery Note (Signed)
Obstetrical Delivery Note   Date of Delivery:   10/16/2021 Primary OB:   [facility] Gestational Age/EDD: [redacted]w[redacted]d (Dated by 11wkUS) Reason for Admission: labor Antepartum complications: none  Delivered By:   Siri Cole, CNM   Delivery Type:   spontaneous vaginal delivery  Delivery Details:   Contractions started around 0100 on 6/2.  SROM for clear during VE, admitted for expectant management.  Received iv pain medication and then a labor epidural.  Augmented with Pitocin.  Found to be rim at 2240, around 0220 began pushing with attempt to reduce cervix, over time cervix reduced, pt made efficient progress after that. Maternal temp found to be 100.4, antibiotics and Tylenol ordered, While reclined with legs back: SVB of viable female at 68.  Infant birthed OA to ROA with a compound hand. To mother's abdomen, HR >100, cry once stimulated.  Cord clamped and cut by dad once pulsation ceased. Placenta delivered with maternal effort. Infant to warmer for assessment.  On inspection of perineum, bilateral labial lacerations found and repaired with 2-0 vicryl. Infant placed back on mom skin to skin. Both stable.  Anesthesia:    epidural and iv medication Intrapartum complications: maternal fever  GBS:    Negative Laceration:    labial Episiotomy:    none Rectal exam:   deferred Placenta:    Delivered and expressed via active management. Intact: yes. To pathology: no.  Delayed Cord Clamping: yes Estimated Blood Loss:   Baby:    Liveborn female, APGARs 7/9, weight 2660gm  Carie Caddy, CNM  [facility], [facility]  @TODAY @  6:50 AM

## 2021-06-02 ENCOUNTER — Ambulatory Visit
Admission: RE | Admit: 2021-06-02 | Discharge: 2021-06-02 | Disposition: A | Discharge status: Home / Self Care | Payer: BC Managed Care – PPO | Source: Ambulatory Visit | Attending: Obstetrics and Gynecology | Admitting: Obstetrics and Gynecology

## 2021-06-02 ENCOUNTER — Other Ambulatory Visit: Payer: Self-pay

## 2021-06-02 DIAGNOSIS — Z3480 Encounter for supervision of other normal pregnancy, unspecified trimester: Secondary | ICD-10-CM

## 2021-06-02 DIAGNOSIS — Z3482 Encounter for supervision of other normal pregnancy, second trimester: Secondary | ICD-10-CM | POA: Diagnosis not present

## 2021-06-03 ENCOUNTER — Ambulatory Visit (INDEPENDENT_AMBULATORY_CARE_PROVIDER_SITE_OTHER): Payer: BC Managed Care – PPO | Admitting: Advanced Practice Midwife

## 2021-06-03 ENCOUNTER — Encounter: Payer: Self-pay | Admitting: Advanced Practice Midwife

## 2021-06-03 VITALS — BP 120/80 | Wt 123.0 lb

## 2021-06-03 DIAGNOSIS — Z3A20 20 weeks gestation of pregnancy: Secondary | ICD-10-CM

## 2021-06-03 DIAGNOSIS — Z3402 Encounter for supervision of normal first pregnancy, second trimester: Secondary | ICD-10-CM

## 2021-06-03 NOTE — Progress Notes (Signed)
Routine Prenatal Care Visit  Subjective  Marie French is a 36 y.o. G1P0000 at [redacted]w[redacted]d being seen today for ongoing prenatal care.  She is currently monitored for the following issues for this low-risk pregnancy and has Supervision of normal pregnancy and Advanced maternal age, 1st pregnancy on their problem list.  ----------------------------------------------------------------------------------- Patient reports no complaints.  She mentions very light spotting after intercourse.  . Vag. Bleeding: None.  Movement: Present. Leaking Fluid denies.  ----------------------------------------------------------------------------------- The following portions of the patient's history were reviewed and updated as appropriate: allergies, current medications, past family history, past medical history, past social history, past surgical history and problem list. Problem list updated.  Objective  Blood pressure 120/80, weight 123 lb (55.8 kg), last menstrual period 01/14/2021. Pregravid weight 113 lb (51.3 kg) Total Weight Gain 10 lb (4.536 kg) Urinalysis: Urine Protein    Urine Glucose    Fetal Status: Fetal Heart Rate (bpm): 154 Fundal Height: 20 cm Movement: Present      Anatomy scan yesterday: complete, normal, female, placenta posterior  General:  Alert, oriented and cooperative. Patient is in no acute distress.  Skin: Skin is warm and dry. No rash noted.   Cardiovascular: Normal heart rate noted  Respiratory: Normal respiratory effort, no problems with respiration noted  Abdomen: Soft, gravid, appropriate for gestational age. Pain/Pressure: Absent     Pelvic:  Cervical exam deferred        Extremities: Normal range of motion.  Edema: None  Mental Status: Normal mood and affect. Normal behavior. Normal judgment and thought content.   Assessment   36 y.o. G1P0000 at [redacted]w[redacted]d by  10/19/2021, by Ultrasound presenting for routine prenatal visit  Plan   [email or web address] Problems (from 03/09/21 to  present)    Problem Noted Resolved   Advanced maternal age, 1st pregnancy 03/31/2021 by Will Bonnet, MD No   Supervision of normal pregnancy 03/09/2021 by Imagene Riches, CNM No   Overview Addendum 04/28/2021  3:17 PM by Allen Derry, [place] Staff Provider  Office Location  [facility] Dating  11 wk u/s  Language  English Anatomy US    Flu Vaccine  03/31/2021 Genetic Screen  NIPS:   TDaP vaccine    Hgb A1C or  GTT Early : Third trimester :   Covid    LAB RESULTS   Rhogam   Blood Type   A+  Feeding Plan breast Antibody  neg  Contraception  Rubella  immune  Circumcision  RPR   NR  Pediatrician   HBsAg   neg  Support Person [place] HIV  Neg  Prenatal Classes  Varicella immune    GBS  (For PCN allergy, check sensitivities)   BTL Consent     VBAC Consent  Pap  NILM    Hgb Electro      CF      SMA                   Preterm labor symptoms and general obstetric precautions including but not limited to vaginal bleeding, contractions, leaking of fluid and fetal movement were reviewed in detail with the patient.    Return in about 4 weeks (around 07/01/2021) for rob.  Rod Can, CNM 06/03/2021 2:33 PM

## 2021-07-01 ENCOUNTER — Other Ambulatory Visit: Payer: Self-pay

## 2021-07-01 ENCOUNTER — Ambulatory Visit (INDEPENDENT_AMBULATORY_CARE_PROVIDER_SITE_OTHER): Payer: Self-pay | Admitting: Obstetrics

## 2021-07-01 VITALS — BP 120/70 | Wt 130.0 lb

## 2021-07-01 DIAGNOSIS — Z3402 Encounter for supervision of normal first pregnancy, second trimester: Secondary | ICD-10-CM

## 2021-07-01 DIAGNOSIS — Z3A24 24 weeks gestation of pregnancy: Secondary | ICD-10-CM

## 2021-07-01 LAB — POCT URINALYSIS DIPSTICK OB
Glucose, UA: NEGATIVE
POC,PROTEIN,UA: NEGATIVE

## 2021-07-01 NOTE — Progress Notes (Signed)
Routine Prenatal Care Visit  Subjective  Marie French is a 36 y.o. G1P0000 at [redacted]w[redacted]d being seen today for ongoing prenatal care.  She is currently monitored for the following issues for this low-risk pregnancy and has Supervision of normal pregnancy and Advanced maternal age, 1st pregnancy on their problem list.  ----------------------------------------------------------------------------------- Patient reports no complaints.    . Vag. Bleeding: None.  Movement: Present. Leaking Fluid denies.  ----------------------------------------------------------------------------------- The following portions of the patient's history were reviewed and updated as appropriate: allergies, current medications, past family history, past medical history, past social history, past surgical history and problem list. Problem list updated.  Objective  Blood pressure 120/70, weight 130 lb (59 kg), last menstrual period 01/14/2021. Pregravid weight 113 lb (51.3 kg) Total Weight Gain 17 lb (7.711 kg) Urinalysis: Urine Protein Negative  Urine Glucose Negative  Fetal Status:     Movement: Present     General:  Alert, oriented and cooperative. Patient is in no acute distress.  Skin: Skin is warm and dry. No rash noted.   Cardiovascular: Normal heart rate noted  Respiratory: Normal respiratory effort, no problems with respiration noted  Abdomen: Soft, gravid, appropriate for gestational age. Pain/Pressure: Absent     Pelvic:  Cervical exam deferred        Extremities: Normal range of motion.     Mental Status: Normal mood and affect. Normal behavior. Normal judgment and thought content.   Assessment   36 y.o. G1P0000 at [redacted]w[redacted]d by  10/19/2021, by Ultrasound presenting for routine prenatal visit  Plan   pregnancy1 Problems (from 03/09/21 to present)    Problem Noted Resolved   Advanced maternal age, 1st pregnancy 03/31/2021 by Will Bonnet, MD No   Supervision of normal pregnancy 03/09/2021 by Imagene Riches, CNM No   Overview Addendum 04/28/2021  3:17 PM by Allen Derry, CNM     Nursing Staff Provider  Office Location  [facility] Dating  11 wk u/s  Language  English Anatomy US    Flu Vaccine  03/31/2021 Genetic Screen  NIPS:   TDaP vaccine    Hgb A1C or  GTT Early : Third trimester :   Covid    LAB RESULTS   Rhogam   Blood Type   A+  Feeding Plan breast Antibody  neg  Contraception  Rubella  immune  Circumcision  RPR   NR  Pediatrician   HBsAg   neg  Support Person [place] HIV  Neg  Prenatal Classes  Varicella immune    GBS  (For PCN allergy, check sensitivities)   BTL Consent     VBAC Consent  Pap  NILM    Hgb Electro      CF      SMA                   Preterm labor symptoms and general obstetric precautions including but not limited to vaginal bleeding, contractions, leaking of fluid and fetal movement were reviewed in detail with the patient. Please refer to After Visit Summary for other counseling recommendations.  28 wk labs next visit.  No follow-ups on file.  Imagene Riches, CNM  07/01/2021 4:59 PM

## 2021-07-30 ENCOUNTER — Encounter: Payer: Self-pay | Admitting: Licensed Practical Nurse

## 2021-07-30 ENCOUNTER — Other Ambulatory Visit: Payer: Self-pay

## 2021-07-30 ENCOUNTER — Ambulatory Visit (INDEPENDENT_AMBULATORY_CARE_PROVIDER_SITE_OTHER): Payer: BC Managed Care – PPO | Admitting: Licensed Practical Nurse

## 2021-07-30 ENCOUNTER — Other Ambulatory Visit: Payer: BC Managed Care – PPO

## 2021-07-30 VITALS — BP 118/60 | Wt 135.0 lb

## 2021-07-30 DIAGNOSIS — Z131 Encounter for screening for diabetes mellitus: Secondary | ICD-10-CM

## 2021-07-30 DIAGNOSIS — Z3400 Encounter for supervision of normal first pregnancy, unspecified trimester: Secondary | ICD-10-CM

## 2021-07-30 DIAGNOSIS — Z3A28 28 weeks gestation of pregnancy: Secondary | ICD-10-CM

## 2021-07-30 LAB — POCT URINALYSIS DIPSTICK OB
Glucose, UA: NEGATIVE
POC,PROTEIN,UA: NEGATIVE

## 2021-07-30 NOTE — Progress Notes (Signed)
Routine Prenatal Care Visit ? ?Subjective  ?Marie French is a 36 y.o. G1P0000 at [redacted]w[redacted]d being seen today for ongoing prenatal care.  She is currently monitored for the following issues for this low-risk pregnancy and has Supervision of normal pregnancy and Advanced maternal age, 1st pregnancy on their problem list.  ?----------------------------------------------------------------------------------- ?Patient reports no complaints.  Doing well. Not sure if she will take CBE, may take BF class.  ? . Vag. Bleeding: None.  Movement: Present. Leaking Fluid denies.  ?----------------------------------------------------------------------------------- ?The following portions of the patient's history were reviewed and updated as appropriate: allergies, current medications, past family history, past medical history, past social history, past surgical history and problem list. Problem list updated. ? ?Objective  ?Blood pressure 118/60, weight 135 lb (61.2 kg), last menstrual period 01/14/2021. ?Pregravid weight 113 lb (51.3 kg) Total Weight Gain 22 lb (9.979 kg) ?Urinalysis: Urine Protein Negative  Urine Glucose Negative ? ?Fetal Status: Fetal Heart Rate (bpm): 138 Fundal Height: 28 cm Movement: Present    ? ?General:  Alert, oriented and cooperative. Patient is in no acute distress.  ?Skin: Skin is warm and dry. No rash noted.   ?Cardiovascular: Normal heart rate noted  ?Respiratory: Normal respiratory effort, no problems with respiration noted  ?Abdomen: Soft, gravid, appropriate for gestational age. Pain/Pressure: Absent     ?Pelvic:  Cervical exam deferred        ?Extremities: Normal range of motion.     ?Mental Status: Normal mood and affect. Normal behavior. Normal judgment and thought content.  ? ?Assessment  ? ?36 y.o. G1P0000 at [redacted]w[redacted]d by  10/19/2021, by Ultrasound presenting for routine prenatal visit ? ?Plan  ? ?pregnancy1 Problems (from 03/09/21 to present)   ? ? Problem Noted Resolved  ? Advanced maternal  age, 1st pregnancy 03/31/2021 by Will Bonnet, MD No  ? Supervision of normal pregnancy 03/09/2021 by Imagene Riches, CNM No  ? Overview Addendum 07/30/2021  8:25 AM by Allen Derry, CNM  ?   ?Nursing Staff Provider  ?Office Location  [facility] Dating  11 wk u/s  ?Language  Media planner [place]  Norm  ?Flu Vaccine  03/31/2021 Genetic Screen  NIPS:   ?TDaP vaccine    Hgb A1C or  ?GTT Early : ?Third trimester :   ?Covid Vaccinated    LAB RESULTS   ?Rhogam  NA Blood Type   A+  ?Feeding Plan breast Antibody  neg  ?Contraception  Rubella  immune  ?Circumcision NA RPR   NR  ?Pediatrician   HBsAg   neg  ?Support Person [place] HIV  Neg  ?Prenatal Classes  Varicella immune  ?  GBS  (For PCN allergy, check sensitivities)   ?BTL Consent     ?VBAC Consent  Pap  NILM  ?  Hgb Electro    ?  CF   ?   SMA   ?     ? ? ?  ?  ? ?  ?  ? ?Preterm labor symptoms and general obstetric precautions including but not limited to vaginal bleeding, contractions, leaking of fluid and fetal movement were reviewed in detail with the patient. ?Please refer to After Visit Summary for other counseling recommendations.  ? ?Return in about 2 weeks (around 08/13/2021) for [place]. ? ?28 wk labs collected ? ?Roberto Scales, CNM  ?Mosetta Pigeon, [place]  ?07/30/21  ?1:08 PM  ? ? ?

## 2021-07-31 LAB — 28 WEEK RH+PANEL
Basophils Absolute: 0.1 10*3/uL (ref 0.0–0.2)
Basos: 1 %
EOS (ABSOLUTE): 0.3 10*3/uL (ref 0.0–0.4)
Eos: 3 %
Gestational Diabetes Screen: 90 mg/dL (ref 70–139)
HIV Screen 4th Generation wRfx: NONREACTIVE
Hematocrit: 30.8 % — ABNORMAL LOW (ref 34.0–46.6)
Hemoglobin: 10.3 g/dL — ABNORMAL LOW (ref 11.1–15.9)
Immature Grans (Abs): 0.1 10*3/uL (ref 0.0–0.1)
Immature Granulocytes: 1 %
Lymphocytes Absolute: 1.6 10*3/uL (ref 0.7–3.1)
Lymphs: 17 %
MCH: 29.9 pg (ref 26.6–33.0)
MCHC: 33.4 g/dL (ref 31.5–35.7)
MCV: 90 fL (ref 79–97)
Monocytes Absolute: 0.5 10*3/uL (ref 0.1–0.9)
Monocytes: 5 %
Neutrophils Absolute: 6.9 10*3/uL (ref 1.4–7.0)
Neutrophils: 73 %
Platelets: 243 10*3/uL (ref 150–450)
RBC: 3.44 x10E6/uL — ABNORMAL LOW (ref 3.77–5.28)
RDW: 12.4 % (ref 11.7–15.4)
RPR Ser Ql: NONREACTIVE
WBC: 9.4 10*3/uL (ref 3.4–10.8)

## 2021-08-02 ENCOUNTER — Encounter: Payer: Self-pay | Admitting: Licensed Practical Nurse

## 2021-08-13 ENCOUNTER — Ambulatory Visit (INDEPENDENT_AMBULATORY_CARE_PROVIDER_SITE_OTHER): Payer: BC Managed Care – PPO | Admitting: Obstetrics

## 2021-08-13 VITALS — BP 122/70 | Wt 135.0 lb

## 2021-08-13 DIAGNOSIS — Z3A30 30 weeks gestation of pregnancy: Secondary | ICD-10-CM

## 2021-08-13 DIAGNOSIS — Z3400 Encounter for supervision of normal first pregnancy, unspecified trimester: Secondary | ICD-10-CM | POA: Diagnosis not present

## 2021-08-13 DIAGNOSIS — Z23 Encounter for immunization: Secondary | ICD-10-CM

## 2021-08-13 NOTE — Progress Notes (Signed)
Routine Prenatal Care Visit ? ?Subjective  ?Marie French is a 36 y.o. G1P0000 at [redacted]w[redacted]d being seen today for ongoing prenatal care.  She is currently monitored for the following issues for this low-risk pregnancy and has Supervision of normal pregnancy and Advanced maternal age, 1st pregnancy on their problem list.  ?----------------------------------------------------------------------------------- ?Patient reports no complaints.  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 ? . Vag. Bleeding: None.  Movement: Present. Leaking Fluid denies.  ?----------------------------------------------------------------------------------- ?The following portions of the patient's history were reviewed and updated as appropriate: allergies, current medications, past family history, past medical history, past social history, past surgical history and problem list. Problem list updated. ? ?Objective  ?Blood pressure 122/70, weight 135 lb (61.2 kg), last menstrual period 01/14/2021. ?Pregravid weight 113 lb (51.3 kg) Total Weight Gain 22 lb (9.979 kg) ?Urinalysis: Urine Protein    Urine Glucose   ? ?Fetal Status:     Movement: Present    ? ?General:  Alert, oriented and cooperative. Patient is in no acute distress.  ?Skin: Skin is warm and dry. No rash noted.   ?Cardiovascular: Normal heart rate noted  ?Respiratory: Normal respiratory effort, no problems with respiration noted  ?Abdomen: Soft, gravid, appropriate for gestational age. Pain/Pressure: Absent     ?Pelvic:  Cervical exam deferred        ?Extremities: Normal range of motion.     ?Mental Status: Normal mood and affect. Normal behavior. Normal judgment and thought content.  ? ?Assessment  ? ?36 y.o. G1P0000 at [redacted]w[redacted]d by  10/19/2021, by Ultrasound presenting for routine prenatal visit ? ?Plan  ? ?pregnancy1 Problems (from 03/09/21 to present)   ? Problem Noted Resolved  ? Advanced maternal age, 1st  pregnancy 03/31/2021 by Will Bonnet, MD No  ? Supervision of normal pregnancy 03/09/2021 by Imagene Riches, CNM No  ? Overview Addendum 07/30/2021  8:25 AM by Allen Derry, CNM  ?   ?Nursing Staff Provider  ?Office Location  [facility] Dating  11 wk u/s  ?Language  Media planner [place]  Norm  ?Flu Vaccine  03/31/2021 Genetic Screen  NIPS:   ?TDaP vaccine    Hgb A1C or  ?GTT Early : ?Third trimester :   ?Covid Vaccinated    LAB RESULTS   ?Rhogam  NA Blood Type   A+  ?Feeding Plan breast Antibody  neg  ?Contraception  Rubella  immune  ?Circumcision NA RPR   NR  ?Pediatrician   HBsAg   neg  ?Support Person [place] HIV  Neg  ?Prenatal Classes  Varicella immune  ?  GBS  (For PCN allergy, check sensitivities)   ?BTL Consent     ?VBAC Consent  Pap  NILM  ?  Hgb Electro    ?  CF   ?   SMA   ?     ? ? ?  ?  ?  ?  ? ?Preterm labor symptoms and general obstetric precautions including but not limited to vaginal bleeding, contractions, leaking of fluid and fetal movement were reviewed in detail with the patient. ?Please refer to After Visit Summary for other counseling recommendations.  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 OK to continue comuting for most of the pregnancy. ?She is traveling in a few weeks and may make her next visit before this trip or just after. ?She measures borderline today ( she is 4  ft 11 in, and her husband is taller ,but slim). Consider growth scan if still measures Size<dates at next visit. ? ?Return in about 2 weeks (around 08/27/2021) for return OB. ? ?Imagene Riches, CNM  ?08/13/2021 9:50 AM   ? ?

## 2021-08-13 NOTE — Progress Notes (Signed)
No vb. No lof. TDAP today.  °

## 2021-08-31 ENCOUNTER — Ambulatory Visit (INDEPENDENT_AMBULATORY_CARE_PROVIDER_SITE_OTHER): Payer: BC Managed Care – PPO | Admitting: Obstetrics

## 2021-08-31 VITALS — BP 120/78 | Wt 137.0 lb

## 2021-08-31 DIAGNOSIS — O09513 Supervision of elderly primigravida, third trimester: Secondary | ICD-10-CM

## 2021-08-31 DIAGNOSIS — Z3400 Encounter for supervision of normal first pregnancy, unspecified trimester: Secondary | ICD-10-CM

## 2021-08-31 DIAGNOSIS — Z3A33 33 weeks gestation of pregnancy: Secondary | ICD-10-CM

## 2021-08-31 NOTE — Progress Notes (Signed)
No vb. No lof.  

## 2021-08-31 NOTE — Progress Notes (Signed)
Routine Prenatal Care Visit ? ?Subjective  ?Marie French is a 36 y.o. G1P0000 at [redacted]w[redacted]d being seen today for ongoing prenatal care.  She is currently monitored for the following issues for this low-risk pregnancy and has Supervision of normal pregnancy and Advanced maternal age, 1st pregnancy on their problem list.  ?----------------------------------------------------------------------------------- ?Patient reports no complaints.   ? . Vag. Bleeding: None.  Movement: Present. Leaking Fluid denies.  ?----------------------------------------------------------------------------------- ?The following portions of the patient's history were reviewed and updated as appropriate: allergies, current medications, past family history, past medical history, past social history, past surgical history and problem list. Problem list updated. ? ?Objective  ?Blood pressure 120/78, weight 137 lb (62.1 kg), last menstrual period 01/14/2021. ?Pregravid weight 113 lb (51.3 kg) Total Weight Gain 24 lb (10.9 kg) ?Urinalysis: Urine Protein    Urine Glucose   ? ?Fetal Status:     Movement: Present    ? ?General:  Alert, oriented and cooperative. Patient is in no acute distress.  ?Skin: Skin is warm and dry. No rash noted.   ?Cardiovascular: Normal heart rate noted  ?Respiratory: Normal respiratory effort, no problems with respiration noted  ?Abdomen: Soft, gravid, appropriate for gestational age. Pain/Pressure: Absent     ?Pelvic:  Cervical exam deferred        ?Extremities: Normal range of motion.     ?Mental Status: Normal mood and affect. Normal behavior. Normal judgment and thought content.  ? ?Assessment  ? ?36 y.o. G1P0000 at [redacted]w[redacted]d by  10/19/2021, by Ultrasound presenting for routine prenatal visit ? ?Plan  ? ?pregnancy1 Problems (from 03/09/21 to present)   ? Problem Noted Resolved  ? Advanced maternal age, 1st pregnancy 03/31/2021 by Will Bonnet, MD No  ? Supervision of normal pregnancy 03/09/2021 by Imagene Riches,  CNM No  ? Overview Addendum 07/30/2021  8:25 AM by Allen Derry, CNM  ?   ?Nursing Staff Provider  ?Office Location  [facility] Dating  11 wk u/s  ?Language  Media planner [place]  Norm  ?Flu Vaccine  03/31/2021 Genetic Screen  NIPS:   ?TDaP vaccine    Hgb A1C or  ?GTT Early : ?Third trimester :   ?Covid Vaccinated    LAB RESULTS   ?Rhogam  NA Blood Type   A+  ?Feeding Plan breast Antibody  neg  ?Contraception  Rubella  immune  ?Circumcision NA RPR   NR  ?Pediatrician   HBsAg   neg  ?Support Person [place] HIV  Neg  ?Prenatal Classes  Varicella immune  ?  GBS  (For PCN allergy, check sensitivities)   ?BTL Consent     ?VBAC Consent  Pap  NILM  ?  Hgb Electro    ?  CF   ?   SMA   ?     ? ? ?  ?  ?  ?  ? ?Preterm labor symptoms and general obstetric precautions including but not limited to vaginal bleeding, contractions, leaking of fluid and fetal movement were reviewed in detail with the patient. ?Please refer to After Visit Summary for other counseling recommendations.  ?Today we discussed the GBS testing that will take place in several weeks. They have had baby showers.she continues to travel for work. Her hubby [place] is here today. ? ?Return in about 2 weeks (around 09/14/2021) for return OB. ? ?Imagene Riches, CNM  ?08/31/2021 8:20 AM   ? ?

## 2021-09-13 ENCOUNTER — Encounter: Payer: Self-pay | Admitting: Advanced Practice Midwife

## 2021-09-13 ENCOUNTER — Ambulatory Visit (INDEPENDENT_AMBULATORY_CARE_PROVIDER_SITE_OTHER): Payer: BC Managed Care – PPO | Admitting: Advanced Practice Midwife

## 2021-09-13 VITALS — BP 120/62 | Wt 141.0 lb

## 2021-09-13 DIAGNOSIS — Z3A34 34 weeks gestation of pregnancy: Secondary | ICD-10-CM

## 2021-09-13 DIAGNOSIS — Z3403 Encounter for supervision of normal first pregnancy, third trimester: Secondary | ICD-10-CM

## 2021-09-13 LAB — POCT URINALYSIS DIPSTICK OB
Glucose, UA: NEGATIVE
POC,PROTEIN,UA: NEGATIVE

## 2021-09-13 NOTE — Progress Notes (Signed)
Routine Prenatal Care Visit ? ?Subjective  ?Marie French is a 36 y.o. G1P0000 at [redacted]w[redacted]d being seen today for ongoing prenatal care.  She is currently monitored for the following issues for this low-risk pregnancy and has Supervision of normal pregnancy and Advanced maternal age, 1st pregnancy on their problem list.  ?----------------------------------------------------------------------------------- ?Patient reports no complaints.   ?Contractions: Not present. Vag. Bleeding: None.  Movement: Present. Leaking Fluid denies.  ?----------------------------------------------------------------------------------- ?The following portions of the patient's history were reviewed and updated as appropriate: allergies, current medications, past family history, past medical history, past social history, past surgical history and problem list. Problem list updated. ? ?Objective  ?Blood pressure 120/62, weight 141 lb (64 kg), last menstrual period 01/14/2021. ?Pregravid weight 113 lb (51.3 kg) Total Weight Gain 28 lb (12.7 kg) ?Urinalysis: Urine Protein    Urine Glucose   ? ?Fetal Status: Fetal Heart Rate (bpm): 117 Fundal Height: 34 cm Movement: Present    ? ?General:  Alert, oriented and cooperative. Patient is in no acute distress.  ?Skin: Skin is warm and dry. No rash noted.   ?Cardiovascular: Normal heart rate noted  ?Respiratory: Normal respiratory effort, no problems with respiration noted  ?Abdomen: Soft, gravid, appropriate for gestational age. Pain/Pressure: Present     ?Pelvic:  Cervical exam deferred        ?Extremities: Normal range of motion.  Edema: None  ?Mental Status: Normal mood and affect. Normal behavior. Normal judgment and thought content.  ? ?Assessment  ? ?36 y.o. G1P0000 at [redacted]w[redacted]d by  10/19/2021, by Ultrasound presenting for routine prenatal visit ? ?Plan  ? ?pregnancy1 Problems (from 03/09/21 to present)   ? Problem Noted Resolved  ? Advanced maternal age, 1st pregnancy 03/31/2021 by Will Bonnet, MD No  ? Supervision of normal pregnancy 03/09/2021 by Imagene Riches, CNM No  ? Overview Addendum 07/30/2021  8:25 AM by Allen Derry, CNM  ?   ?Nursing Staff Provider  ?Office Location  [facility] Dating  11 wk u/s  ?Language  Media planner [place]  Norm  ?Flu Vaccine  03/31/2021 Genetic Screen  NIPS:   ?TDaP vaccine    Hgb A1C or  ?GTT Early : ?Third trimester :   ?Covid Vaccinated    LAB RESULTS   ?Rhogam  NA Blood Type   A+  ?Feeding Plan breast Antibody  neg  ?Contraception  Rubella  immune  ?Circumcision NA RPR   NR  ?Pediatrician   HBsAg   neg  ?Support Person [place] HIV  Neg  ?Prenatal Classes  Varicella immune  ?  GBS  (For PCN allergy, check sensitivities)   ?BTL Consent     ?VBAC Consent  Pap  NILM  ?  Hgb Electro    ?  CF   ?   SMA   ?     ? ? ?  ?  ?  ?  ? ?Preterm labor symptoms and general obstetric precautions including but not limited to vaginal bleeding, contractions, leaking of fluid and fetal movement were reviewed in detail with the patient. ?Please refer to After Visit Summary for other counseling recommendations.  ? ?Return in about 2 weeks (around 09/27/2021) for [place]. ? ?Rod Can, CNM ?09/13/2021 8:45 AM   ? ?

## 2021-09-13 NOTE — Patient Instructions (Signed)
Pain Relief During Labor and Delivery ?Many things can cause pain during labor and delivery, including: ?Pressure due to the baby moving through the pelvis. ?Stretching of tissues due to the baby moving through the birth canal. ?Muscle tension due to anxiety or nervousness. ?The uterus tightening (contracting)and relaxing to help move the baby. ?How do I get pain relief during labor and delivery? ? ?Discuss your pain relief options with your health care provider during your prenatal visits. Explore the options offered by your hospital or birth center. There are many ways to deal with the pain of labor and delivery. You can try relaxation techniques or doing relaxing activities, taking a warm shower or bath (hydrotherapy), or other methods. There are also many medicines available to help control pain. ?Relaxation techniques and activities ?Practice relaxation techniques or do relaxing activities, such as: ?Focused breathing. ?Meditation. ?Visualization. ?Aroma therapy. ?Listening to your favorite music. ?Hypnosis. ?Hydrotherapy ?Take a warm shower or bath. This may: ?Provide comfort and relaxation. ?Lessen your feeling of pain. ?Reduce the amount of pain medicine needed. ?Shorten the length of labor. ?Other methods ?Try doing other things, such as: ?Getting a massage or having counterpressure on your back. ?Applying warm packs or ice packs. ?Changing positions often, moving around, or using a birthing ball. ?Medicines ?You may be given: ?Pain medicine through an IV or an injection into a muscle. ?Pain medicine inserted into your spinal column. ?Injections of sterile water just under the skin on your lower back. ?Nitrous oxide inhalation therapy, also called laughing gas. ?What kinds of medicine are available for pain relief? ?There are two kinds of medicines that can be used to relieve pain during labor and delivery: ?Analgesics. These medicines decrease pain without causing you to lose feeling or the ability to move  your muscles. ?Anesthetics. These medicines block feeling in the body and can decrease your ability to move freely. ?Both kinds of medicine can cause minor side effects, such as nausea, trouble concentrating, and sleepiness. They can also affect the baby's heart rate before birth and his or her breathing after birth. For this reason, health care providers are careful about when and how much medicine is given. ?Which medicines are used to provide pain relief? ?Common medicines ?The most common medicines used to help manage pain during labor and delivery include: ?Opioids. Opioids are medicines that decrease how much pain you feel (perception of pain). These medicines can be given through an IV or may be used with anesthetics to block pain. ?Epidural analgesia. ?Epidural analgesia is given through a very thin tube that is inserted into the lower back. Medicine is delivered continuously to the area near your spinal column nerves (epidural space). After having this treatment, you may be able to move your legs, but you will not be able to walk. Depending on the amount and type of medicine given, you may lose all feeling in the lower half of your body, or you may have some sensation, including the urge to push. This treatment can be used to give pain relief for a vaginal birth. ?Sometimes, a numbing medicine is injected into the spinal fluid when an epidural catheter is placed. This provides for immediate relief but only lasts for 1-2 hours. Once it wears off, the epidural will provide pain relief. This is called a combined spinal-epidural (CSE) block. ?Intrathecal analgesia (spinal analgesia). Intrathecal analgesia is similar to epidural analgesia, but the medicine is injected into the spinal fluid instead of the epidural space. It is usually only given once.  It starts to relieve pain quickly, but the pain relief lasts only 1-2 hours. ?Pudendal block. This block is done by injecting numbing medicine through the wall of  the vagina and into a nerve in the pelvis. ?Other medicines ?Other medicines used to help manage pain during labor and delivery include: ?Local anesthetics. These are used to numb a small area of the body. They may be used along with another kind of medicine or used to numb the nerves of the vagina, cervix, and perineum during the second stage of labor. ?Spinal block (spinal anesthesia). Spinal anesthesia is similar to spinal analgesia, but the medicine that is used contains longer-acting numbing medicines and pain medicines. This type of anesthesia can be used for a cesarean delivery and allows you to stay awake for the birth of your baby. ?General anesthetics cause you to lose consciousness so you do not feel pain. They are usually only used for an emergency cesarean delivery. These medicines are given through an IV or a mask or both. ?These medicines are used as part of a procedure or for an emergency delivery. ?Summary ?Women have many options to help them manage the pain associated with labor and delivery. ?You can try doing relaxing activities, taking a warm shower or bath, or other methods. ?There are also many medicines available to help control pain during labor and delivery. ?Talk with your health care provider about what options are available to you. ?This information is not intended to replace advice given to you by your health care provider. Make sure you discuss any questions you have with your health care provider. ?Document Revised: 03/20/2019 Document Reviewed: 03/20/2019 ?Elsevier Patient Education ? 2023 [employer] ?Group B Streptococcus Test During Pregnancy ?Why am I having this test? ?Routine testing, also called screening, for group B streptococcus (GBS) is recommended for all pregnant women between the 36th and 37th week of pregnancy. GBS is a type of bacteria that can be passed from mother to baby during childbirth. Screening will help guide whether or not you will need treatment during  labor and delivery to prevent complications such as: ?An infection in your uterus during labor. ?An infection in your uterus after delivery. ?A serious infection in your baby after delivery, such as pneumonia, meningitis, or sepsis. ?GBS screening is not often done before 36 weeks of pregnancy unless you go into labor prematurely. ?What happens if I have group B streptococcus? ?If testing shows that you have GBS, your health care provider will recommend treatment with IV antibiotics during labor and delivery. This treatment significantly decreases the risk of complications for you and your baby. ?If you have a planned C-section and you have GBS, you may not need to be treated with antibiotics because GBS is usually passed to babies after labor starts and your water breaks. If you are in labor or your water breaks before your C-section, it is possible for GBS to get into your uterus and be passed to your baby, so you might need treatment. ?Is there a chance I may not need to be tested? ?You may not need to be tested for GBS if: ?You have a urine test that shows GBS before 36 to 37 weeks. ?You had a baby with GBS infection after a previous delivery. ?In these cases, you will automatically be treated for GBS during labor and delivery. ?What is being tested? ?This test is done to check if you have group B streptococcus in your vagina or rectum. ?What kind of sample is  taken? ?To collect samples for this test, your health care provider will swab your vagina and rectum with a cotton swab. The sample is then sent to the lab to see if GBS is present. ?What happens during the test? ? ?You will remove your clothing from the waist down. ?You will lie down on an exam table in the same position as you would for a pelvic exam. ?Your health care provider will swab your vagina and rectum to collect samples for a culture test. ?You will be able to go home after the test and do all your usual activities. ?How are the results  reported? ?The test results are reported as positive or negative. ?What do the results mean? ?A positive test means you are at risk for passing GBS to your baby during labor and delivery. Your health care prov

## 2021-09-13 NOTE — Addendum Note (Signed)
Addended by: Burtis Junes on: 09/13/2021 08:49 AM ? ? Modules accepted: Orders ? ?

## 2021-09-14 ENCOUNTER — Encounter: Payer: BC Managed Care – PPO | Admitting: Licensed Practical Nurse

## 2021-09-28 ENCOUNTER — Ambulatory Visit (INDEPENDENT_AMBULATORY_CARE_PROVIDER_SITE_OTHER): Payer: BC Managed Care – PPO | Admitting: Advanced Practice Midwife

## 2021-09-28 ENCOUNTER — Other Ambulatory Visit (HOSPITAL_COMMUNITY)
Admission: RE | Admit: 2021-09-28 | Discharge: 2021-09-28 | Disposition: A | Discharge status: Home / Self Care | Payer: BC Managed Care – PPO | Source: Ambulatory Visit | Attending: Advanced Practice Midwife | Admitting: Advanced Practice Midwife

## 2021-09-28 ENCOUNTER — Encounter: Payer: Self-pay | Admitting: Advanced Practice Midwife

## 2021-09-28 VITALS — BP 120/70 | Wt 142.0 lb

## 2021-09-28 DIAGNOSIS — Z113 Encounter for screening for infections with a predominantly sexual mode of transmission: Secondary | ICD-10-CM | POA: Insufficient documentation

## 2021-09-28 DIAGNOSIS — Z3403 Encounter for supervision of normal first pregnancy, third trimester: Secondary | ICD-10-CM | POA: Diagnosis present

## 2021-09-28 DIAGNOSIS — Z3685 Encounter for antenatal screening for Streptococcus B: Secondary | ICD-10-CM

## 2021-09-28 DIAGNOSIS — Z3A37 37 weeks gestation of pregnancy: Secondary | ICD-10-CM | POA: Insufficient documentation

## 2021-09-28 DIAGNOSIS — Z369 Encounter for antenatal screening, unspecified: Secondary | ICD-10-CM | POA: Insufficient documentation

## 2021-09-28 DIAGNOSIS — O98513 Other viral diseases complicating pregnancy, third trimester: Secondary | ICD-10-CM

## 2021-09-28 DIAGNOSIS — B009 Herpesviral infection, unspecified: Secondary | ICD-10-CM

## 2021-09-28 MED ORDER — VALACYCLOVIR HCL 500 MG PO TABS: 500.0000 mg | ORAL_TABLET | Freq: Two times a day (BID) | ORAL | 6 refills | Status: DC

## 2021-09-28 NOTE — Progress Notes (Signed)
Routine Prenatal Care Visit ? ?Subjective  ?Marie French is a 36 y.o. G1P0000 at [redacted]w[redacted]d being seen today for ongoing prenatal care.  She is currently monitored for the following issues for this low-risk pregnancy and has Supervision of normal pregnancy and Advanced maternal age, 1st pregnancy on their problem list.  ?----------------------------------------------------------------------------------- ?Patient reports no complaints. 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 Discussed HSV suppression therapy starting now.  ?Contractions: Not present. Vag. Bleeding: None.  Movement: Present. Leaking Fluid denies.  ?----------------------------------------------------------------------------------- ?The following portions of the patient's history were reviewed and updated as appropriate: allergies, current medications, past family history, past medical history, past social history, past surgical history and problem list. Problem list updated. ? ?Objective  ?Blood pressure 120/70, weight 142 lb (64.4 kg), last menstrual period 01/14/2021. ?Pregravid weight 113 lb (51.3 kg) Total Weight Gain 29 lb (13.2 kg) ?Urinalysis: Urine Protein    Urine Glucose   ? ?Fetal Status: Fetal Heart Rate (bpm): 139 Fundal Height: 36 cm Movement: Present  Presentation: Vertex ? ?General:  Alert, oriented and cooperative. Patient is in no acute distress.  ?Skin: Skin is warm and dry. No rash noted.   ?Cardiovascular: Normal heart rate noted  ?Respiratory: Normal respiratory effort, no problems with respiration noted  ?Abdomen: Soft, gravid, appropriate for gestational age. Pain/Pressure: Present     ?Pelvic:  Cervical exam performed Dilation: 1 Effacement (%): 50, 60 Station: -3, -2, GBS/aptima collected  ?Extremities: Normal range of motion.  Edema: None  ?Mental Status: Normal mood and affect. Normal behavior. Normal judgment and thought content.  ? ?Assessment  ? ?36 y.o. G1P0000 at [redacted]w[redacted]d by  10/19/2021, by Ultrasound  presenting for routine prenatal visit ? ?Plan  ? ?pregnancy1 Problems (from 03/09/21 to present)   ? Problem Noted Resolved  ? Advanced maternal age, 1st pregnancy 03/31/2021 by Conard Novak, MD No  ? Supervision of normal pregnancy 03/09/2021 by Mirna Mires, CNM No  ? Overview Addendum 07/30/2021  8:25 AM by Ellwood Sayers, CNM  ?   ?Nursing Staff Provider  ?Office Location  [facility] Dating  11 wk u/s  ?Language  Ambulance person [place]  Norm  ?Flu Vaccine  03/31/2021 Genetic Screen  NIPS:   ?TDaP vaccine    Hgb A1C or  ?GTT Early : ?Third trimester :   ?Covid Vaccinated    LAB RESULTS   ?Rhogam  NA Blood Type   A+  ?Feeding Plan breast Antibody  neg  ?Contraception  Rubella  immune  ?Circumcision NA RPR   NR  ?Pediatrician   HBsAg   neg  ?Support Person [place] HIV  Neg  ?Prenatal Classes  Varicella immune  ?  GBS  (For PCN allergy, check sensitivities)   ?BTL Consent     ?VBAC Consent  Pap  NILM  ?  Hgb Electro    ?  CF   ?   SMA   ?     ? ? ?  ?  ?  ?  ? ?Term labor symptoms and general obstetric precautions including but not limited to vaginal bleeding, contractions, leaking of fluid and fetal movement were reviewed in detail with the patient. ?Please refer to After Visit Summary for other counseling recommendations.  ? ?Return in about 1 week (around 10/05/2021) for rob. ? ?[name], CNM ?09/28/2021 9:27 AM   ? ?

## 2021-09-29 LAB — CERVICOVAGINAL ANCILLARY ONLY
Chlamydia: NEGATIVE
Comment: NEGATIVE
Comment: NEGATIVE
Comment: NORMAL
Neisseria Gonorrhea: NEGATIVE
Trichomonas: NEGATIVE

## 2021-09-30 ENCOUNTER — Telehealth: Payer: Self-pay

## 2021-09-30 NOTE — Telephone Encounter (Signed)
FMLA/DISABILITY form for Marie French filled out.  Will get signature and process.

## 2021-10-02 LAB — STREP GP B CULTURE+RFLX: Strep Gp B Culture+Rflx: NEGATIVE

## 2021-10-05 ENCOUNTER — Ambulatory Visit (INDEPENDENT_AMBULATORY_CARE_PROVIDER_SITE_OTHER): Payer: BC Managed Care – PPO | Admitting: Obstetrics

## 2021-10-05 VITALS — BP 122/74 | Wt 144.0 lb

## 2021-10-05 DIAGNOSIS — Z3403 Encounter for supervision of normal first pregnancy, third trimester: Secondary | ICD-10-CM

## 2021-10-05 DIAGNOSIS — Z3A38 38 weeks gestation of pregnancy: Secondary | ICD-10-CM

## 2021-10-05 NOTE — Progress Notes (Signed)
Routine Prenatal Care Visit  Subjective  Marie French is a 36 y.o. G1P0000 at [redacted]w[redacted]d being seen today for ongoing prenatal care.  She is currently monitored for the following issues for this low-risk pregnancy and has Supervision of normal pregnancy and Advanced maternal age, 1st pregnancy on their problem list.  ----------------------------------------------------------------------------------- Patient reports no complaints.  She has been nesting. No regular contractions. Contractions: Irregular. Vag. Bleeding: None.  Movement: Present. Leaking Fluid denies.  ----------------------------------------------------------------------------------- The following portions of the patient's history were reviewed and updated as appropriate: allergies, current medications, past family history, past medical history, past social history, past surgical history and problem list. Problem list updated.  Objective  Blood pressure 122/74, weight 144 lb (65.3 kg), last menstrual period 01/14/2021. Pregravid weight 113 lb (51.3 kg) Total Weight Gain 31 lb (14.1 kg) Urinalysis: Urine Protein    Urine Glucose    Fetal Status:     Movement: Present     General:  Alert, oriented and cooperative. Patient is in no acute distress.  Skin: Skin is warm and dry. No rash noted.   Cardiovascular: Normal heart rate noted  Respiratory: Normal respiratory effort, no problems with respiration noted  Abdomen: Soft, gravid, appropriate for gestational age. Pain/Pressure: Absent     Pelvic:  Cervical exam deferred        Extremities: Normal range of motion.     Mental Status: Normal mood and affect. Normal behavior. Normal judgment and thought content.   Assessment   36 y.o. G1P0000 at [redacted]w[redacted]d by  10/19/2021, by Ultrasound presenting for routine prenatal visit  Plan   pregnancy1 Problems (from 03/09/21 to present)    Problem Noted Resolved   Advanced maternal age, 1st pregnancy 03/31/2021 by Conard Novak, MD No    Supervision of normal pregnancy 03/09/2021 by Mirna Mires, CNM No   Overview Addendum 07/30/2021  8:25 AM by Ellwood Sayers, CNM     Nursing Staff Provider  Office Location  [facility] Dating  11 wk u/s  Language  English Anatomy US  Norm  Flu Vaccine  03/31/2021 Genetic Screen  NIPS:   TDaP vaccine    Hgb A1C or  GTT Early : Third trimester :   Covid Vaccinated    LAB RESULTS   Rhogam  NA Blood Type   A+  Feeding Plan breast Antibody  neg  Contraception  Rubella  immune  Circumcision NA RPR   NR  Pediatrician   HBsAg   neg  Support Person [place] HIV  Neg  Prenatal Classes  Varicella immune    GBS  (For PCN allergy, check sensitivities)   BTL Consent     VBAC Consent  Pap  NILM    Hgb Electro      CF      SMA                   Term labor symptoms and general obstetric precautions including but not limited to vaginal bleeding, contractions, leaking of fluid and fetal movement were reviewed in detail with the patient. Please refer to After Visit Summary for other counseling recommendations.  We discussed  Natural cervical ripeners.  Return in about 1 week (around 10/12/2021) for return OB.  Mirna Mires, CNM  10/05/2021 8:43 AM

## 2021-10-05 NOTE — Progress Notes (Signed)
No vb. No lof.  

## 2021-10-12 ENCOUNTER — Ambulatory Visit (INDEPENDENT_AMBULATORY_CARE_PROVIDER_SITE_OTHER): Payer: BC Managed Care – PPO | Admitting: Obstetrics

## 2021-10-12 VITALS — BP 108/70 | Wt 144.6 lb

## 2021-10-12 DIAGNOSIS — O99019 Anemia complicating pregnancy, unspecified trimester: Secondary | ICD-10-CM | POA: Insufficient documentation

## 2021-10-12 DIAGNOSIS — O98513 Other viral diseases complicating pregnancy, third trimester: Secondary | ICD-10-CM

## 2021-10-12 DIAGNOSIS — O09513 Supervision of elderly primigravida, third trimester: Secondary | ICD-10-CM

## 2021-10-12 DIAGNOSIS — Z3A39 39 weeks gestation of pregnancy: Secondary | ICD-10-CM

## 2021-10-12 DIAGNOSIS — B009 Herpesviral infection, unspecified: Secondary | ICD-10-CM

## 2021-10-12 LAB — POCT URINALYSIS DIPSTICK OB
Glucose, UA: NEGATIVE
POC,PROTEIN,UA: NEGATIVE

## 2021-10-12 NOTE — Progress Notes (Unsigned)
Subjective: Patient returns for pregnancy follow-up. She has no concerns.  She reports good fetal movement, denies vaginal bleeding or leakage of fluid. She denies regular contractions. She denies urinary symptoms. Denies headaches, blurred vision or epigastric pain.   Objective: BP 108/70   Wt 144 lb 9.6 oz (65.6 kg)   LMP 01/14/2021 (Approximate)   BMI 29.21 kg/m  Physical Exam Vitals and nursing note reviewed.  Constitutional:      Appearance: Normal appearance.  HENT:     Head: Normocephalic and atraumatic.  Eyes:     Extraocular Movements: Extraocular movements intact.  Pulmonary:     Effort: Pulmonary effort is normal.  Abdominal:     Palpations: Mass: gravid FH 35.  Musculoskeletal:        General: Normal range of motion.     Cervical back: Normal range of motion.  Skin:    General: Skin is warm and dry.  Neurological:     General: No focal deficit present.     Mental Status: She is alert and oriented to person, place, and time.  Psychiatric:        Mood and Affect: Mood normal.        Behavior: Behavior normal.        Thought Content: Thought content normal.   Assessment and Plan:  Patient Marie French is an 36 y.o. year old G1P0000 at [redacted]w[redacted]d with [facility] of Estimated Date of Delivery: 10/19/21 with Primigravida of advanced maternal age in third trimester  [redacted] weeks gestation of pregnancy - Plan: POC Urinalysis Dipstick OB  Maternal anemia in pregnancy, antepartum  Herpes simplex virus type 2 (HSV-2) infection affecting pregnancy in third trimester  Labs: At VI RI GBS neg AMA - not currenty on ASA. recommend growth [place] and antenatal testing. Will schedule.  Level 1 anatomy US wnl.  Genetics: low risk CFDNA  MSAFP missed; carrier screen neg  Its a girl Marie French Immunizations: s/p flu, COVID tsap  Weekly NST AFI starting now  HSV 2 on proph with valtrex Maternal anemia pregnancy - hgb 10.3 on iron. Rpt hgb today unable to be obtained - can do so next visit  ** Measures S<D today plan growth [place].  Discussed delivery plans. Discussed recommendtion tyically delivery by 40 weeks. Patient desires spontaneous labor but will consider IOL after 40 weeks  Return for weekly ROB until delivery.

## 2021-10-12 NOTE — Patient Instructions (Signed)
Please call if you have elevated blood pressure, vision changes, headache, right-sided upper abdominal pain.  Please call if you have contractions occurring every 5-10 minutes for 1-2 hours, if you have vaginal bleeding, if watery fluid is leaking from your vagina, or if you have decreased fetal movement.  If you are not yet signed up on [facility] MyChart, please ask us how to sign up for it!  

## 2021-10-13 ENCOUNTER — Encounter: Payer: Self-pay | Admitting: Obstetrics

## 2021-10-13 DIAGNOSIS — O98513 Other viral diseases complicating pregnancy, third trimester: Secondary | ICD-10-CM | POA: Insufficient documentation

## 2021-10-15 ENCOUNTER — Inpatient Hospital Stay
Admission: EM | Admit: 2021-10-15 | Discharge: 2021-10-18 | DRG: 806 | Disposition: A | Discharge status: Home / Self Care | Payer: BC Managed Care – PPO | Attending: Licensed Practical Nurse | Admitting: Licensed Practical Nurse

## 2021-10-15 ENCOUNTER — Inpatient Hospital Stay: Payer: BC Managed Care – PPO | Admitting: Anesthesiology

## 2021-10-15 ENCOUNTER — Encounter: Payer: Self-pay | Admitting: Obstetrics and Gynecology

## 2021-10-15 ENCOUNTER — Other Ambulatory Visit: Payer: Self-pay

## 2021-10-15 DIAGNOSIS — O4202 Full-term premature rupture of membranes, onset of labor within 24 hours of rupture: Secondary | ICD-10-CM

## 2021-10-15 DIAGNOSIS — O09293 Supervision of pregnancy with other poor reproductive or obstetric history, third trimester: Secondary | ICD-10-CM

## 2021-10-15 DIAGNOSIS — Z87891 Personal history of nicotine dependence: Secondary | ICD-10-CM

## 2021-10-15 DIAGNOSIS — O09513 Supervision of elderly primigravida, third trimester: Secondary | ICD-10-CM

## 2021-10-15 DIAGNOSIS — Z3A39 39 weeks gestation of pregnancy: Secondary | ICD-10-CM

## 2021-10-15 DIAGNOSIS — A6000 Herpesviral infection of urogenital system, unspecified: Secondary | ICD-10-CM | POA: Diagnosis present

## 2021-10-15 DIAGNOSIS — O09523 Supervision of elderly multigravida, third trimester: Secondary | ICD-10-CM | POA: Diagnosis not present

## 2021-10-15 DIAGNOSIS — D62 Acute posthemorrhagic anemia: Secondary | ICD-10-CM | POA: Diagnosis not present

## 2021-10-15 DIAGNOSIS — O479 False labor, unspecified: Secondary | ICD-10-CM | POA: Diagnosis present

## 2021-10-15 DIAGNOSIS — O9081 Anemia of the puerperium: Secondary | ICD-10-CM | POA: Diagnosis not present

## 2021-10-15 DIAGNOSIS — O26893 Other specified pregnancy related conditions, third trimester: Secondary | ICD-10-CM | POA: Diagnosis present

## 2021-10-15 DIAGNOSIS — Z3403 Encounter for supervision of normal first pregnancy, third trimester: Principal | ICD-10-CM

## 2021-10-15 DIAGNOSIS — O9832 Other infections with a predominantly sexual mode of transmission complicating childbirth: Principal | ICD-10-CM | POA: Diagnosis present

## 2021-10-15 LAB — CBC
HCT: 36.6 % (ref 36.0–46.0)
Hemoglobin: 12.3 g/dL (ref 12.0–15.0)
MCH: 29 pg (ref 26.0–34.0)
MCHC: 33.6 g/dL (ref 30.0–36.0)
MCV: 86.3 fL (ref 80.0–100.0)
Platelets: 199 10*3/uL (ref 150–400)
RBC: 4.24 MIL/uL (ref 3.87–5.11)
RDW: 13.2 % (ref 11.5–15.5)
WBC: 18.8 10*3/uL — ABNORMAL HIGH (ref 4.0–10.5)
nRBC: 0 % (ref 0.0–0.2)

## 2021-10-15 LAB — RAPID HIV SCREEN (HIV 1/2 AB+AG)
HIV 1/2 Antibodies: NONREACTIVE
HIV-1 P24 Antigen - HIV24: NONREACTIVE

## 2021-10-15 LAB — ABO/RH: ABO/RH(D): A POS

## 2021-10-15 LAB — TYPE AND SCREEN
ABO/RH(D): A POS
Antibody Screen: NEGATIVE

## 2021-10-15 MED ORDER — LIDOCAINE HCL (PF) 1 % IJ SOLN: 30.0000 mL | INTRAMUSCULAR | Status: DC | PRN

## 2021-10-15 MED ORDER — OXYTOCIN BOLUS FROM INFUSION
333.0000 mL | Freq: Once | INTRAVENOUS | Status: AC
  Administered 2021-10-16: 333 mL via INTRAVENOUS

## 2021-10-15 MED ORDER — PHENYLEPHRINE 80 MCG/ML (10ML) SYRINGE FOR IV PUSH (FOR BLOOD PRESSURE SUPPORT): 80.0000 ug | PREFILLED_SYRINGE | INTRAVENOUS | Status: DC | PRN

## 2021-10-15 MED ORDER — LIDOCAINE HCL (PF) 1 % IJ SOLN
INTRAMUSCULAR | Status: DC | PRN
  Administered 2021-10-15: 3 mL

## 2021-10-15 MED ORDER — DIPHENHYDRAMINE HCL 50 MG/ML IJ SOLN
INTRAMUSCULAR | Status: AC
  Administered 2021-10-15: 50 mg via INTRAVENOUS
  Filled 2021-10-15: qty 1

## 2021-10-15 MED ORDER — FENTANYL-BUPIVACAINE-NACL 0.5-0.125-0.9 MG/250ML-% EP SOLN
12.0000 mL/h | EPIDURAL | Status: DC | PRN
  Administered 2021-10-15: 12 mL/h via EPIDURAL
  Filled 2021-10-15: qty 250

## 2021-10-15 MED ORDER — DIPHENHYDRAMINE HCL 25 MG PO CAPS
50.0000 mg | ORAL_CAPSULE | Freq: Once | ORAL | Status: DC
  Filled 2021-10-15: qty 2

## 2021-10-15 MED ORDER — EPHEDRINE 5 MG/ML INJ: 10.0000 mg | INTRAVENOUS | Status: DC | PRN

## 2021-10-15 MED ORDER — BUTORPHANOL TARTRATE 2 MG/ML IJ SOLN
1.0000 mg | INTRAMUSCULAR | Status: DC | PRN
  Administered 2021-10-15: 1 mg via INTRAVENOUS
  Filled 2021-10-15: qty 1

## 2021-10-15 MED ORDER — SOD CITRATE-CITRIC ACID 500-334 MG/5ML PO SOLN
30.0000 mL | ORAL | Status: DC | PRN
Start: 2021-10-15 — End: 2021-10-18

## 2021-10-15 MED ORDER — ONDANSETRON HCL 4 MG/2ML IJ SOLN: 4.0000 mg | Freq: Four times a day (QID) | INTRAMUSCULAR | Status: DC | PRN

## 2021-10-15 MED ORDER — LACTATED RINGERS IV SOLN: INTRAVENOUS | Status: DC

## 2021-10-15 MED ORDER — OXYTOCIN-SODIUM CHLORIDE 30-0.9 UT/500ML-% IV SOLN
2.5000 [IU]/h | INTRAVENOUS | Status: DC
  Administered 2021-10-16: 2.5 [IU]/h via INTRAVENOUS
  Filled 2021-10-15: qty 1000

## 2021-10-15 MED ORDER — LIDOCAINE HCL (PF) 1 % IJ SOLN
INTRAMUSCULAR | Status: AC
  Filled 2021-10-15: qty 30

## 2021-10-15 MED ORDER — DIPHENHYDRAMINE HCL 50 MG/ML IJ SOLN: 50.0000 mg | Freq: Once | INTRAMUSCULAR | Status: AC

## 2021-10-15 MED ORDER — MISOPROSTOL 200 MCG PO TABS
ORAL_TABLET | ORAL | Status: AC
  Filled 2021-10-15: qty 4

## 2021-10-15 MED ORDER — OXYTOCIN 10 UNIT/ML IJ SOLN
INTRAMUSCULAR | Status: AC
  Filled 2021-10-15: qty 2

## 2021-10-15 MED ORDER — OXYTOCIN-SODIUM CHLORIDE 30-0.9 UT/500ML-% IV SOLN
1.0000 m[IU]/min | INTRAVENOUS | Status: DC
  Administered 2021-10-15: 2 m[IU]/min via INTRAVENOUS

## 2021-10-15 MED ORDER — LACTATED RINGERS IV SOLN: 500.0000 mL | INTRAVENOUS | Status: DC | PRN

## 2021-10-15 MED ORDER — LACTATED RINGERS IV SOLN
500.0000 mL | Freq: Once | INTRAVENOUS | Status: AC
Start: 2021-10-15 — End: 2021-10-15
  Administered 2021-10-15: 500 mL via INTRAVENOUS

## 2021-10-15 MED ORDER — DIPHENHYDRAMINE HCL 50 MG/ML IJ SOLN: 12.5000 mg | INTRAMUSCULAR | Status: DC | PRN

## 2021-10-15 MED ORDER — TERBUTALINE SULFATE 1 MG/ML IJ SOLN: 0.2500 mg | Freq: Once | INTRAMUSCULAR | Status: DC | PRN

## 2021-10-15 MED ORDER — AMMONIA AROMATIC IN INHA
RESPIRATORY_TRACT | Status: AC
  Filled 2021-10-15: qty 10

## 2021-10-15 MED ORDER — LIDOCAINE-EPINEPHRINE (PF) 1.5 %-1:200000 IJ SOLN
INTRAMUSCULAR | Status: DC | PRN
  Administered 2021-10-15: 3 mL via EPIDURAL

## 2021-10-15 MED ORDER — BUPIVACAINE HCL (PF) 0.25 % IJ SOLN
INTRAMUSCULAR | Status: DC | PRN
  Administered 2021-10-15: 3 mL via EPIDURAL
  Administered 2021-10-15: 4 mL via EPIDURAL

## 2021-10-15 NOTE — Anesthesia Preprocedure Evaluation (Signed)
Anesthesia Evaluation  Patient identified by MRN, date of birth, ID band Patient awake    Reviewed: Allergy & Precautions, H&P , NPO status , Patient's Chart, lab work & pertinent test results  Airway Mallampati: II  TM Distance: >3 FB Neck ROM: full    Dental no notable dental hx.    Pulmonary neg pulmonary ROS, former smoker,    Pulmonary exam normal        Cardiovascular negative cardio ROS Normal cardiovascular exam     Neuro/Psych negative neurological ROS  negative psych ROS   GI/Hepatic Neg liver ROS, GERD  ,  Endo/Other  negative endocrine ROS  Renal/GU negative Renal ROS  negative genitourinary   Musculoskeletal   Abdominal   Peds negative pediatric ROS (+)  Hematology negative hematology ROS (+)   Anesthesia Other Findings   Reproductive/Obstetrics (+) Pregnancy                             Anesthesia Physical Anesthesia Plan  ASA: 2  Anesthesia Plan: Epidural   Post-op Pain Management:    Induction:   PONV Risk Score and Plan:   Airway Management Planned:   Additional Equipment:   Intra-op Plan:   Post-operative Plan:   Informed Consent: I have reviewed the patients History and Physical, chart, labs and discussed the procedure including the risks, benefits and alternatives for the proposed anesthesia with the patient or authorized representative who has indicated his/her understanding and acceptance.       Plan Discussed with: Anesthesiologist and CRNA  Anesthesia Plan Comments:         Anesthesia Quick Evaluation

## 2021-10-15 NOTE — Progress Notes (Signed)
   Subjective:  Pt comfortable with epidural, feels some rectal pressure.   Objective:   Vitals: Blood pressure 109/78, pulse (!) 103, temperature 99.2 F (37.3 C), temperature source Oral, resp. rate 16, height 4\' 11"  (1.499 m), weight 64.4 kg, last menstrual period 01/14/2021, SpO2 98 %. General: NAD Abdomen:non tender Cervical Exam:  Dilation: 7 Effacement (%):  (swollen) Station: -1 Presentation: Vertex Exam by:: [email or web address] CNM  FHT: baseline 140, moderate variability, pos accel, variables present  Toco:q 1.5-4, moderate to strong, soft resting tone Pitocin at 8 milli-units/hour  Results for orders placed or performed during the hospital encounter of 10/15/21 (from the past 24 hour(s))  CBC     Status: Abnormal   Collection Time: 10/15/21  1:12 PM  Result Value Ref Range   WBC 18.8 (H) 4.0 - 10.5 K/uL   RBC 4.24 3.87 - 5.11 MIL/uL   Hemoglobin 12.3 12.0 - 15.0 g/dL   HCT 12/15/21 57.3 - 22.0 %   MCV 86.3 80.0 - 100.0 fL   MCH 29.0 26.0 - 34.0 pg   MCHC 33.6 30.0 - 36.0 g/dL   RDW 25.4 27.0 - 62.3 %   Platelets 199 150 - 400 K/uL   nRBC 0.0 0.0 - 0.2 %  Type and screen [facility]     Status: None   Collection Time: 10/15/21  1:12 PM  Result Value Ref Range   ABO/RH(D) A POS    Antibody Screen NEG    Sample Expiration      10/18/2021,2359 Performed at [facility], [address] [place], [place]   Rapid HIV screen (HIV 1/2 Ab+Ag)     Status: None   Collection Time: 10/15/21  1:12 PM  Result Value Ref Range   HIV-1 P24 Antigen - HIV24 NON REACTIVE NON REACTIVE   HIV 1/2 Antibodies NON REACTIVE NON REACTIVE   Interpretation (HIV Ag Ab)      A non reactive test result means that HIV 1 or HIV 2 antibodies and HIV 1 p24 antigen were not detected in the specimen.  ABO/Rh     Status: None   Collection Time: 10/15/21  2:43 PM  Result Value Ref Range   ABO/RH(D)      A POS Performed at [facility], [address] [place], [place]     Assessment:   36 y.o. G1P0000 [redacted]w[redacted]d in active labor  Plan:   1) Labor -active labor, Benadryl given for swollen cervix. Rn changing position as needed.   2) Fetus - category 2 tracing   3) ID: GBS negative, ruptured  since 1040, WBC 18.8 temp 99.2  4) Pain management: Epidural managed by Anesthesia  [redacted]w[redacted]d, CNM  Carie Caddy, [facility]  @TODAY @  9:54 PM

## 2021-10-15 NOTE — OB Triage Note (Signed)
Patient is a G1P0, [redacted]w[redacted]d that presents with complaints of contractions every 5 minutes. Patient states that the contractions started around 1a last night, 10/15/21, and have continued. Pt states that the intensity of the contractions is stronger. She rates her pain a 7 out of a 10 on a 0-10 pain scale. Patient reports no LOF, but some bloody, mucous discharge. Patient reports +FM. VSS. Patient said that she had a cervical check in office on Tuesday, dilation was 3 and effacement was 70. Monitors applied and assessing.

## 2021-10-15 NOTE — Anesthesia Procedure Notes (Signed)
Epidural Patient location during procedure: OB Start time: 10/15/2021 4:49 PM End time: 10/15/2021 5:00 PM  Staffing Anesthesiologist: Corinda Gubler, MD Resident/CRNA: Jeanine Luz, CRNA Performed: resident/CRNA   Preanesthetic Checklist Completed: patient identified, IV checked, site marked, risks and benefits discussed, surgical consent, monitors and equipment checked, pre-op evaluation and timeout performed  Epidural Patient position: sitting Prep: ChloraPrep Patient monitoring: heart rate, continuous pulse ox and blood pressure Approach: midline Location: L3-L4 Injection technique: LOR saline  Needle:  Needle type: Tuohy  Needle gauge: 17 G Needle length: 9 cm and 9 Needle insertion depth: 5.5 cm Catheter type: closed end flexible Catheter size: 19 Gauge Catheter at skin depth: 11 cm Test dose: negative and 1.5% lidocaine with Epi 1:200 K  Assessment Events: blood not aspirated, injection not painful, no injection resistance, no paresthesia and negative IV test  Additional Notes 1 attempt Pt. Evaluated and documentation done after procedure finished. Patient identified. Risks/Benefits/Options discussed with patient including but not limited to bleeding, infection, nerve damage, paralysis, failed block, incomplete pain control, headache, blood pressure changes, nausea, vomiting, reactions to medication both or allergic, itching and postpartum back pain. Confirmed with bedside nurse the patient's most recent platelet count. Confirmed with patient that they are not currently taking any anticoagulation, have any bleeding history or any family history of bleeding disorders. Patient expressed understanding and wished to proceed. All questions were answered. Sterile technique was used throughout the entire procedure. Please see nursing notes for vital signs. Test dose was given through epidural catheter and negative prior to continuing to dose epidural or start infusion. Warning signs  of high block given to the patient including shortness of breath, tingling/numbness in hands, complete motor block, or any concerning symptoms with instructions to call for help. Patient was given instructions on fall risk and not to get out of bed. All questions and concerns addressed with instructions to call with any issues or inadequate analgesia.    Patient tolerated the insertion well without immediate complications.Reason for block:procedure for pain

## 2021-10-15 NOTE — Progress Notes (Signed)
   Subjective:  Comfortable with epidural. Discussed minimal cervical change, recommend starting Pitocin.  Pt agreeable to plan.  Partner and mother at her bedside.   Objective:   Vitals: Blood pressure 106/65, pulse (!) 120, temperature 98 F (36.7 C), temperature source Oral, resp. rate 16, height 4\' 11"  (1.499 m), weight 64.4 kg, last menstrual period 01/14/2021, SpO2 93 %. General:  Abdomen: Cervical Exam:  Dilation: 4.5 Effacement (%): 80 Station: 0 Presentation: Vertex Exam by:: [email or web address], CNM  FHT: baseline 125, moderate variability, pos accel, 1 isolated variable.  Toco:q 3-4.5, soft resting tone   Results for orders placed or performed during the hospital encounter of 10/15/21 (from the past 24 hour(s))  CBC     Status: Abnormal   Collection Time: 10/15/21  1:12 PM  Result Value Ref Range   WBC 18.8 (H) 4.0 - 10.5 K/uL   RBC 4.24 3.87 - 5.11 MIL/uL   Hemoglobin 12.3 12.0 - 15.0 g/dL   HCT 12/15/21 40.0 - 86.7 %   MCV 86.3 80.0 - 100.0 fL   MCH 29.0 26.0 - 34.0 pg   MCHC 33.6 30.0 - 36.0 g/dL   RDW 61.9 50.9 - 32.6 %   Platelets 199 150 - 400 K/uL   nRBC 0.0 0.0 - 0.2 %  Type and screen [facility]     Status: None   Collection Time: 10/15/21  1:12 PM  Result Value Ref Range   ABO/RH(D) A POS    Antibody Screen NEG    Sample Expiration      10/18/2021,2359 Performed at [facility], [address] [place], [place]   Rapid HIV screen (HIV 1/2 Ab+Ag)     Status: None   Collection Time: 10/15/21  1:12 PM  Result Value Ref Range   HIV-1 P24 Antigen - HIV24 NON REACTIVE NON REACTIVE   HIV 1/2 Antibodies NON REACTIVE NON REACTIVE   Interpretation (HIV Ag Ab)      A non reactive test result means that HIV 1 or HIV 2 antibodies and HIV 1 p24 antigen were not detected in the specimen.  ABO/Rh     Status: None   Collection Time: 10/15/21  2:43 PM  Result Value Ref Range   ABO/RH(D)      A POS Performed at [facility], [address] [place], [place]     Assessment:   36 y.o. G1P0000 [redacted]w[redacted]d admitted in early labor   Plan:   1) Labor -early labor, will augment with Pitocin.   2) Fetus - category 1 tracing   3) ID: GBS neg, ruptured since 1040, clear fluid.  4) Pain management: epidural managed by Anesthesia   [redacted]w[redacted]d, CNM  Carie Caddy, [facility]  @TODAY @  6:25 PM

## 2021-10-15 NOTE — H&P (Signed)
OB History & Physical   History of Present Illness:  Chief Complaint:   HPI:  Marie French is a 36 y.o. G1P0000 female at [redacted]w[redacted]d dated by 11wk [place].  She presents to L&D for contractions since 1 am.  Contractions were every 15 mins than around 7am they became more regular-5 mins apart and intense. While sitting at home she did feel moist and then noted bloody show. She endorses  +FM.  She early and regular prenatal care.  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 She was started on HSV prophylaxis at 37 weeks.   Clear-pink fluid noted with VE at 1040, pt ambulated in hallway and reported having gushes of fluid with contractions.  Clear-pink fluid noted on peri pad.   Pregnancy Issues: 1. HSV, never had an outbreak 2. AMA   Maternal Medical History:   Past Medical History:  Diagnosis Date   Hypertrophy of tonsils     Past Surgical History:  Procedure Laterality Date   MOUTH SURGERY     gum graft. conscious sedation   TONSILLECTOMY AND ADENOIDECTOMY N/A 12/13/2016   Procedure: TONSILLECTOMY;  Surgeon: Geanie Logan, MD;  Location: [facility];  Service: ENT;  Laterality: N/A;    Allergies  Allergen Reactions   Penicillins Anaphylaxis    Prior to Admission medications   Medication Sig Start Date End Date Taking? Authorizing Provider  Bacillus Coagulans-Inulin (ALIGN PREBIOTIC-PROBIOTIC PO)  07/14/21  Yes [provider]  ferrous sulfate 325 (65 FE) MG tablet Take 325 mg by mouth daily with breakfast.   Yes [provider]  Prenatal Vit-Fe Fumarate-FA (MULTIVITAMIN-PRENATAL) 27-0.8 MG TABS tablet Take 1 tablet by mouth daily at 12 noon.   Yes [provider]  valACYclovir (VALTREX) 500 MG tablet Take 1 tablet (500 mg total) by mouth 2 (two) times daily. 09/28/21  Yes [name], CNM     Prenatal care site: [facility]   Social History: She  reports that she quit smoking about 6 years ago. Her smoking use included cigarettes. She has never  used smokeless tobacco. She reports that she does not currently use alcohol after a past usage of about 3.0 standard drinks per week. She reports that she does not use drugs.  Family History: family history includes Breast cancer in her maternal grandmother; Diabetes in her father and sister; Hypercholesterolemia in her sister; Hypertension in her mother and sister.   Review of Systems: A full review of systems was performed and negative except as noted in the HPI.     Physical Exam:  Vital Signs: BP 128/75 (BP Location: Right Arm)   Pulse 68   Temp 98.2 F (36.8 C) (Oral)   Resp 16   Ht 4\' 11"  (1.499 m)   Wt 64.4 kg   LMP 01/14/2021 (Approximate)   BMI 28.68 kg/m  General: no acute distress.  HEENT: normocephalic, atraumatic Heart: not assessed  Lungs:, normal respiratory effort Abdomen: soft, gravid, non-tender;  EFW: 6lbs  Pelvic: SSE no active lesion,   External: Normal external female genitalia no lesions, 1-2 mm macule present on left labia majora.   Cervix: Dilation: 3 / Effacement (%): 70 / Station: -2    Extremities: non-tender, symmetric, No  edema bilaterally.   Neurologic: Alert & oriented x 3.    Results for orders placed or performed during the hospital encounter of 10/15/21 (from the past 24 hour(s))  CBC     Status: Abnormal   Collection Time: 10/15/21  1:12 PM  Result  Value Ref Range   WBC 18.8 (H) 4.0 - 10.5 K/uL   RBC 4.24 3.87 - 5.11 MIL/uL   Hemoglobin 12.3 12.0 - 15.0 g/dL   HCT 02.7 25.3 - 66.4 %   MCV 86.3 80.0 - 100.0 fL   MCH 29.0 26.0 - 34.0 pg   MCHC 33.6 30.0 - 36.0 g/dL   RDW 40.3 47.4 - 25.9 %   Platelets 199 150 - 400 K/uL   nRBC 0.0 0.0 - 0.2 %    Pertinent Results:  Prenatal Labs: Blood type/Rh A positive   Antibody screen neg  Rubella Immune  Varicella Immune  RPR NR  HBsAg Neg  HIV NR  GC neg  Chlamydia neg  Genetic screening negative  1 hour GTT 90  3 hour GTT   GBS Negative    DGL:OVFIEPPI 125,  moderate variability,  pos accel, neg decel  TOCO:q 2-4, mild-mid, soft resting tone  SVE:  Dilation: 3 / Effacement (%): 70 / Station: -2    Cephalic by leopolds  No results found.  Assessment:  Marie French is a 36 y.o. G1P0000 female at [redacted]w[redacted]d with SROM in early labor.   Plan:  Admit to Labor & Delivery.  Discussed expectation with versus expectant management in the setting of ruptured membranes. Pt desires a low intervention birth, understands the need for intervention but would prefer expectant management for now.  CBC, T&S, Clrs, IV to HL  GBS  negative Consents obtained. Intermittent auscultation per policy  Pain Management: desires an unmedicated birth   ----- Carie Caddy, CNM  [facility]

## 2021-10-16 ENCOUNTER — Encounter: Payer: Self-pay | Admitting: Obstetrics and Gynecology

## 2021-10-16 LAB — CBC WITH DIFFERENTIAL/PLATELET
Abs Immature Granulocytes: 0.15 10*3/uL — ABNORMAL HIGH (ref 0.00–0.07)
Basophils Absolute: 0.1 10*3/uL (ref 0.0–0.1)
Basophils Relative: 0 %
Eosinophils Absolute: 0.3 10*3/uL (ref 0.0–0.5)
Eosinophils Relative: 1 %
HCT: 32.1 % — ABNORMAL LOW (ref 36.0–46.0)
Hemoglobin: 10.6 g/dL — ABNORMAL LOW (ref 12.0–15.0)
Immature Granulocytes: 1 %
Lymphocytes Relative: 12 %
Lymphs Abs: 2.9 10*3/uL (ref 0.7–4.0)
MCH: 29 pg (ref 26.0–34.0)
MCHC: 33 g/dL (ref 30.0–36.0)
MCV: 87.9 fL (ref 80.0–100.0)
Monocytes Absolute: 1.6 10*3/uL — ABNORMAL HIGH (ref 0.1–1.0)
Monocytes Relative: 7 %
Neutro Abs: 19.2 10*3/uL — ABNORMAL HIGH (ref 1.7–7.7)
Neutrophils Relative %: 79 %
Platelets: 167 10*3/uL (ref 150–400)
RBC: 3.65 MIL/uL — ABNORMAL LOW (ref 3.87–5.11)
RDW: 13.3 % (ref 11.5–15.5)
WBC: 24.2 10*3/uL — ABNORMAL HIGH (ref 4.0–10.5)
nRBC: 0 % (ref 0.0–0.2)

## 2021-10-16 LAB — CBC
HCT: 31.8 % — ABNORMAL LOW (ref 36.0–46.0)
Hemoglobin: 10.8 g/dL — ABNORMAL LOW (ref 12.0–15.0)
MCH: 29.4 pg (ref 26.0–34.0)
MCHC: 34 g/dL (ref 30.0–36.0)
MCV: 86.6 fL (ref 80.0–100.0)
Platelets: 168 10*3/uL (ref 150–400)
RBC: 3.67 MIL/uL — ABNORMAL LOW (ref 3.87–5.11)
RDW: 13.2 % (ref 11.5–15.5)
WBC: 26.6 10*3/uL — ABNORMAL HIGH (ref 4.0–10.5)
nRBC: 0 % (ref 0.0–0.2)

## 2021-10-16 LAB — RPR: RPR Ser Ql: NONREACTIVE

## 2021-10-16 MED ORDER — COCONUT OIL OIL
1.0000 "application " | TOPICAL_OIL | Status: DC | PRN
  Filled 2021-10-16: qty 120

## 2021-10-16 MED ORDER — PRENATAL MULTIVITAMIN CH
1.0000 | ORAL_TABLET | Freq: Every day | ORAL | Status: DC
  Administered 2021-10-17: 1 via ORAL
  Filled 2021-10-16: qty 1

## 2021-10-16 MED ORDER — IBUPROFEN 600 MG PO TABS
600.0000 mg | ORAL_TABLET | Freq: Four times a day (QID) | ORAL | Status: DC
  Administered 2021-10-16 – 2021-10-17 (×5): 600 mg via ORAL
  Filled 2021-10-16 (×5): qty 1

## 2021-10-16 MED ORDER — GENTAMICIN SULFATE 40 MG/ML IJ SOLN
5.0000 mg/kg | INTRAVENOUS | Status: DC
  Filled 2021-10-16 (×2): qty 8

## 2021-10-16 MED ORDER — ONDANSETRON HCL 4 MG/2ML IJ SOLN: 4.0000 mg | INTRAMUSCULAR | Status: DC | PRN

## 2021-10-16 MED ORDER — SIMETHICONE 80 MG PO CHEW: 80.0000 mg | CHEWABLE_TABLET | ORAL | Status: DC | PRN

## 2021-10-16 MED ORDER — DOCUSATE SODIUM 100 MG PO CAPS
100.0000 mg | ORAL_CAPSULE | Freq: Two times a day (BID) | ORAL | Status: DC
  Administered 2021-10-16 – 2021-10-17 (×3): 100 mg via ORAL
  Filled 2021-10-16 (×3): qty 1

## 2021-10-16 MED ORDER — VANCOMYCIN HCL IN DEXTROSE 1-5 GM/200ML-% IV SOLN
1000.0000 mg | Freq: Two times a day (BID) | INTRAVENOUS | Status: DC
  Filled 2021-10-16: qty 200

## 2021-10-16 MED ORDER — BENZOCAINE-MENTHOL 20-0.5 % EX AERO
1.0000 "application " | INHALATION_SPRAY | CUTANEOUS | Status: DC | PRN
  Administered 2021-10-16: 1 via TOPICAL
  Filled 2021-10-16: qty 56

## 2021-10-16 MED ORDER — DIBUCAINE (PERIANAL) 1 % EX OINT: 1.0000 "application " | TOPICAL_OINTMENT | CUTANEOUS | Status: DC | PRN

## 2021-10-16 MED ORDER — ACETAMINOPHEN 500 MG PO TABS
1000.0000 mg | ORAL_TABLET | Freq: Four times a day (QID) | ORAL | Status: DC
  Administered 2021-10-16 – 2021-10-17 (×5): 1000 mg via ORAL
  Filled 2021-10-16 (×5): qty 2

## 2021-10-16 MED ORDER — COCONUT OIL OIL
TOPICAL_OIL | Status: AC
  Filled 2021-10-16: qty 120

## 2021-10-16 MED ORDER — ACETAMINOPHEN 500 MG PO TABS
1000.0000 mg | ORAL_TABLET | Freq: Four times a day (QID) | ORAL | Status: AC | PRN
  Administered 2021-10-16: 1000 mg via ORAL
  Filled 2021-10-16: qty 2

## 2021-10-16 MED ORDER — DIPHENHYDRAMINE HCL 25 MG PO CAPS: 25.0000 mg | ORAL_CAPSULE | Freq: Four times a day (QID) | ORAL | Status: DC | PRN

## 2021-10-16 MED ORDER — WITCH HAZEL-GLYCERIN EX PADS
1.0000 "application " | MEDICATED_PAD | CUTANEOUS | Status: DC | PRN
  Administered 2021-10-16: 1 via TOPICAL
  Filled 2021-10-16: qty 100

## 2021-10-16 MED ORDER — ZOLPIDEM TARTRATE 5 MG PO TABS: 5.0000 mg | ORAL_TABLET | Freq: Every evening | ORAL | Status: DC | PRN

## 2021-10-16 MED ORDER — ONDANSETRON HCL 4 MG PO TABS: 4.0000 mg | ORAL_TABLET | ORAL | Status: DC | PRN

## 2021-10-16 NOTE — Lactation Note (Signed)
This note was copied from a baby's chart. Lactation Consultation Note  Patient Name: Girl Malala Trenkamp [id number] Date: 10/16/2021 Reason for consult: Follow-up assessment;Mother's request;Primapara;Term;Infant < 6lbs Age:36 hours  Maternal Data Has patient been taught Hand Expression?: Yes Does the patient have breastfeeding experience prior to this delivery?: No  Feeding Mother's Current Feeding Choice: Breast Milk  Clothing and blanket removed from baby. Baby woke easily with diaper change.   LATCH Score Latch: Repeated attempts needed to sustain latch, nipple held in mouth throughout feeding, stimulation needed to elicit sucking reflex.  Audible Swallowing: None  Type of Nipple: Everted at rest and after stimulation (short)  Comfort (Breast/Nipple): Soft / non-tender  Hold (Positioning): No assistance needed to correctly position infant at breast.  LATCH Score: 7  Attempts made at the breast to sustain latch, mom independent in positioning, sandwiching of tissue, and attempted latching.  Nipple appears somewhat flat/short, but compressible and pliable tissue can potentially have deep latch; nipple shield was tried- accepted but not sucking.  Lactation Tools Discussed/Used Tools: Nipple Shields Nipple shield size: 16 (attempt to use)  Interventions Interventions: Assisted with latch;Breast massage;Hand express;Support pillows;Position options;Education (spoon fed 66mL)  LC hand expressed from L breast after attempts at the breast and spoon fed approximately 17mL to baby; tolerated well. Encouraged continued frequent feeding attempts and to spend time skin to skin with baby.  Discharge    Consult Status Consult Status: Follow-up Date: 10/16/21 Follow-up type: In-patient    Danford Bad 10/16/2021, 1:06 PM

## 2021-10-16 NOTE — Anesthesia Postprocedure Evaluation (Signed)
Anesthesia Post Note  Patient: Marie French  Procedure(s) Performed: AN AD HOC LABOR EPIDURAL  Patient location during evaluation: Mother Baby Anesthesia Type: Epidural Level of consciousness: awake and alert Pain management: pain level controlled Vital Signs Assessment: post-procedure vital signs reviewed and stable Respiratory status: spontaneous breathing and nonlabored ventilation Cardiovascular status: blood pressure returned to baseline and stable Postop Assessment: no headache and no backache Anesthetic complications: no   No notable events documented.   Last Vitals:  Vitals:   10/16/21 1157 10/16/21 1454  BP: 105/73 112/84  Pulse: 86 85  Resp: 20 20  Temp: 36.4 C 37.1 C  SpO2: 98% 98%    Last Pain:  Vitals:   10/16/21 1620  TempSrc:   PainSc: 3                  Lannette Donath

## 2021-10-16 NOTE — Discharge Summary (Addendum)
Obstetrical Discharge Summary  Date of Admission: 10/15/2021 Date of Discharge: 10/18/2021  Primary OB: [facility]  Gestational Age at Delivery: [redacted]w[redacted]d   Antepartum complications: none Reason for Admission: labor Date of Delivery: 10/16/21  Delivered By: Siri Cole, CNM  Delivery Type: spontaneous vaginal delivery Intrapartum complications/course: maternal fever  Anesthesia: epidural and iv medication Placenta: Delivered and expressed via active management. Intact: yes. To pathology: yes.  Laceration: labial Episiotomy: none EBL: Baby: Liveborn female, APGARs 7/9, weight 2660 g.    Discharge Diagnosis: Delivered.   Postpartum course: She is tolerating regular diet, her pain is controlled with PO medication, she is ambulating and voiding without difficulty. She reports some difficulty with baby latching at breast. She is successfully pumping and has been supplementing with donor milk. She will follow up with LC.  Discharge Vital Signs:  Current Vital Signs 24h Vital Sign Ranges  T 97.8 F (36.6 C) Temp  Avg: 97.9 F (36.6 C)  Min: 97.6 F (36.4 C)  Max: 98.4 F (36.9 C)  BP 116/80 BP  Min: 116/80  Max: 117/79  HR 76 Pulse  Avg: 81.7  Min: 76  Max: 87  RR 18 Resp  Avg: 18  Min: 18  Max: 18  SaO2 99 % Room Air SpO2  Avg: 99.7 %  Min: 99 %  Max: 100 %       24 Hour I/O Current Shift I/O  Time Ins Outs No intake/output data recorded. No intake/output data recorded.    Patient Vitals for the past 6 hrs:  BP Temp Temp src Pulse Resp SpO2  10/18/21 0823 116/80 97.8 F (36.6 C) Oral 76 18 99 %    Discharge Exam:  NAD Perineum: labial lacerations repaired Abdomen: firm fundus below the umbilicus, NTTP, non distended, +bowel sounds.   RRR no MRGs CTAB Ext: no c/c/e  Recent Labs  Lab 10/16/21 0929 10/16/21 1655 10/18/21 0553  WBC 26.6* 24.2* 12.4*  HGB 10.8* 10.6* 9.7*  HCT 31.8* 32.1* 29.1*  PLT 168 167 182    Edinburgh score: 2  Disposition: Home  Rh Immune  globulin given: no Rubella vaccine given: no Tdap vaccine given in AP or PP setting: yes Flu vaccine given in AP or PP setting: yes  Contraception:  undecided   Prenatal/Postnatal Panel: A POS Performed at [facility], [address], [place], [place] [address] Ishmael Holter Immune//Varicella Immune//RPR negative//HIV negative/HepB Surface Ag negative//pap no abnormalities (date: 02/2021)//plans to breastfeed  Plan:  Sheniah Supak was discharged to home in good condition. Follow-up appointment with LMD in 2 and 6  weeks for a PP visit  No future appointments.   Discharge Medications: Allergies as of 10/18/2021       Reactions   Penicillins Anaphylaxis        Medication List     STOP taking these medications    ferrous sulfate 325 (65 FE) MG tablet   valACYclovir 500 MG tablet Commonly known as: VALTREX       TAKE these medications    ALIGN PREBIOTIC-PROBIOTIC PO   multivitamin-prenatal 27-0.8 MG Tabs tablet Take 1 tablet by mouth daily at 12 noon.       Parke Poisson, CNM [facility] 10/18/2021, 10:08 AM

## 2021-10-16 NOTE — Lactation Note (Signed)
This note was copied from a baby's chart. Lactation Consultation Note  Patient Name: Marie French [id number] Date: 10/16/2021 Reason for consult: Initial assessment;Primapara;Term;Infant < 6lbs Age:36 hours  Maternal Data Has patient been taught Hand Expression?: Yes Does the patient have breastfeeding experience prior to this delivery?: No  Mom desires exclusive BF  Feeding Mother's Current Feeding Choice: Breast Milk  Attempted to BF shortly after delivery, lasted a short period of minutes, no success with other attempts.  LATCH Score Latch: Too sleepy or reluctant, no latch achieved, no sucking elicited.  Baby was sleeping soundly, when transitioned to mom baby became spitty: clear fluid expelled.  Encouraged skin to skin time with baby to aid in transition and settling of the stomach.   Lactation Tools Discussed/Used    Interventions Interventions: Breast feeding basics reviewed;Skin to skin;Hand express;Support pillows;Education (impact of spitting/gagging can have on desire to feed, normal newborn feeding behaviors and patterns, early cues, importance of skin to skin)  Discharge    Consult Status Consult Status: Follow-up Date: 10/16/21 Follow-up type: In-patient  Whiteboard updated with LC name/number.  Lavonia Drafts 10/16/2021, 11:53 AM

## 2021-10-16 NOTE — Progress Notes (Signed)
Spoke with Rod Can, CNM about ordered antibiotics for suspected chorio. Pt has no fever and no signs of infection postpartum, so antiobiotics will be discontinued. Nurse notified pharmacy of this change.

## 2021-10-16 NOTE — Discharge Instructions (Signed)
Discharge Instructions:   Follow-up Appointment: Call and schedule a follow-up appointment for a visit in 2-weeks (if needed) and a 6-week postpartum visit with Carie Caddy, CNM at [facility]!  If there are any new medications, they have been ordered and will be available for pickup at the listed pharmacy on your way home from the hospital.   Call office if you have any of the following: headache, visual changes, fever >101.0 F, chills, shortness of breath, breast concerns, excessive vaginal bleeding, incision drainage or problems, leg pain or redness, depression or any other concerns. If you have vaginal discharge with an odor, let your doctor know.   It is normal to bleed for up to 6 weeks. You should not soak through more than 1 pad in 1 hour. If you have a blood clot larger than your fist with continued bleeding, call your doctor.   Activity: Do not lift > 10 lbs for 6 weeks (do not lift anything heavier than your baby). No intercourse, tampons, swimming pools, hot tubs, baths (only showers) for 6 weeks.  No driving for 1-2 weeks. Continue prenatal vitamin, especially if breastfeeding. Increase calories and fluids (water) while breastfeeding.   Your milk will come in, in the next couple of days (right now it is colostrum). You may have a slight fever when your milk comes in, but it should go away on its own.  If it does not, and rises above 101 F please call the doctor. You will also feel achy and your breasts will be firm. They will also start to leak. If you are breastfeeding, continue as you have been and you can pump/express milk for comfort.   If you have too much milk, your breasts can become engorged, which could lead to mastitis. This is an infection of the milk ducts. It can be very painful and you will need to notify your doctor to obtain a prescription for antibiotics. You can also treat it with a shower or hot/cold compress.   For concerns about your baby, please call your  pediatrician.  For breastfeeding concerns, the lactation consultant can be reached at [phone].   Postpartum blues (feelings of happy one minute and sad another minute) are normal for the first few weeks but if it gets worse let your doctor know.   Congratulations! We enjoyed caring for you and your new bundle of joy!

## 2021-10-16 NOTE — Progress Notes (Signed)
Brief note: Maternal temp now 100.4.  Antibiotics and Tylenol ordered 9.5 cm since 2240, attempted to reduce lip over last 30 minutes, remaining cervix stretchy but present over anterior aspect of fetal head. Pushing with good effort, able to move head to +1 with contractions. Occasional variables present.  Dr Valentino Saxon called, updated on pt's status.   Will continue to push and reassess at 1 hour hour of pushing.  Carie Caddy, CNM  [place] [facility], [facility]  @TODAY @  3:17 AM

## 2021-10-16 NOTE — Progress Notes (Signed)
Subjective:  Doing well postpartum day 0; she is tolerating regular diet, her pain is controlled with PO medication, she is ambulating and voiding without difficulty. She reports baby has been sleepy at breast since initial latch. She appreciates LC assistance.  Objective:  Vital signs in last 24 hours: Temp:  [98 F (36.7 C)-100.4 F (38 C)] 98.2 F (36.8 C) (06/03 1026) Pulse Rate:  [75-131] 83 (06/03 1026) Resp:  [16-18] 18 (06/03 0400) BP: (95-131)/(52-100) 99/69 (06/03 1026) SpO2:  [92 %-100 %] 99 % (06/03 1026)    General: NAD Pulmonary: no increased work of breathing Abdomen: non-distended, non-tender, fundus firm at level of umbilicus Extremities: no edema, no erythema, no tenderness  Results for orders placed or performed during the hospital encounter of 10/15/21 (from the past 72 hour(s))  CBC     Status: Abnormal   Collection Time: 10/15/21  1:12 PM  Result Value Ref Range   WBC 18.8 (H) 4.0 - 10.5 K/uL   RBC 4.24 3.87 - 5.11 MIL/uL   Hemoglobin 12.3 12.0 - 15.0 g/dL   HCT 82.0 81.3 - 88.7 %   MCV 86.3 80.0 - 100.0 fL   MCH 29.0 26.0 - 34.0 pg   MCHC 33.6 30.0 - 36.0 g/dL   RDW 19.5 97.4 - 71.8 %   Platelets 199 150 - 400 K/uL   nRBC 0.0 0.0 - 0.2 %    Comment: Performed at [facility], [address] [place], [place] [address]  Type and screen [facility]     Status: None   Collection Time: 10/15/21  1:12 PM  Result Value Ref Range   ABO/RH(D) A POS    Antibody Screen NEG    Sample Expiration      10/18/2021,2359 Performed at [facility], [address] [place], [place] [address]   Rapid HIV screen (HIV 1/2 Ab+Ag)     Status: None   Collection Time: 10/15/21  1:12 PM  Result Value Ref Range   HIV-1 P24 Antigen - HIV24 NON REACTIVE NON REACTIVE    Comment: (NOTE) Detection of p24 may be inhibited by biotin in the sample, causing false negative results in acute infection.    HIV 1/2 Antibodies NON REACTIVE  NON REACTIVE   Interpretation (HIV Ag Ab)      A non reactive test result means that HIV 1 or HIV 2 antibodies and HIV 1 p24 antigen were not detected in the specimen.    Comment: Performed at [facility], [address] [place], [place] [address]  ABO/Rh     Status: None   Collection Time: 10/15/21  2:43 PM  Result Value Ref Range   ABO/RH(D)      A POS Performed at [facility], [address] [place], [place] [address]   CBC     Status: Abnormal   Collection Time: 10/16/21  9:29 AM  Result Value Ref Range   WBC 26.6 (H) 4.0 - 10.5 K/uL   RBC 3.67 (L) 3.87 - 5.11 MIL/uL   Hemoglobin 10.8 (L) 12.0 - 15.0 g/dL   HCT 71.5 (L) 95.3 - 96.7 %   MCV 86.6 80.0 - 100.0 fL   MCH 29.4 26.0 - 34.0 pg   MCHC 34.0 30.0 - 36.0 g/dL   RDW 28.9 79.1 - 50.4 %   Platelets 168 150 - 400 K/uL   nRBC 0.0 0.0 - 0.2 %    Comment: Performed at [facility], [address] [place], [place] [address]    Assessment:   36 y.o. G1P1001 postpartum day # 0, lactating  Plan:    1) Acute blood loss anemia - hemodynamically stable and asymptomatic - po ferrous sulfate  2) Blood Type --/--/A POS Performed at [facility], [address] [place], [place] [address]  [phone]) / Ishmael Holter 1.32 (11/16 1508) / Varicella Immune  3) TDAP status  given antepartum  4) Feeding plan:  breastfeeding  5)  Education given regarding options for contraception, as well as compatibility with breast feeding if applicable.  Patient is undecided for method of  contraception.  6) Disposition: continue current care   [name], CNM [facility] 10/16/2021, 10:31 AM

## 2021-10-16 NOTE — Progress Notes (Signed)
Pharmacy Antibiotic Note  Marie French is a 36 y.o. female admitted on 10/15/2021 with chorioamnionitis.  Pharmacy has been consulted for Gentamicin and Vancomycin dosing.  Plan: Gentamicin 5 mg/kg q24h & Vancomycin 1 gm q12h per indication and recommendation per [facility] pharmacist.  Pharmacy will continue to follow and will adjust abx dosing whenever warranted.  Height: 4\' 11"  (149.9 cm) Weight: 64.4 kg (142 lb) IBW/kg (Calculated) : 43.2  Temp (24hrs), Avg:99 F (37.2 C), Min:98 F (36.7 C), Max:100.4 F (38 C)  Recent Labs  Lab 10/15/21 1312  WBC 18.8*    CrCl cannot be calculated (No successful lab value found.).    Allergies  Allergen Reactions   Penicillins Anaphylaxis    Antimicrobials this admission: 6/03 Gentamicin >>  6/03 Vancomycin >>   Microbiology results: No labs currently ordered or pending at this time.  Thank you for allowing pharmacy to be a part of this patient's care.  8/03, PharmD, MBA 10/16/2021 4:30 AM

## 2021-10-16 NOTE — Progress Notes (Signed)
   Subjective:  Pt comfortable with epidural but aware of contractions. Attempted to reduce cervix over 2 contractions, cervix stretchy but palpated fully around the fetal head. Feelign tired and ready to meet baby,   Objective:   Vitals: Blood pressure 101/61, pulse 81, temperature 99.1 F (37.3 C), temperature source Oral, resp. rate 16, height 4\' 11"  (1.499 m), weight 64.4 kg, last menstrual period 01/14/2021, SpO2 99 %. General: NAD, tearful out of frustration  Abdomen:non tender  Cervical Exam:  Dilation: 9 Effacement (%):  (swollen) Station: 0 Presentation: Vertex Exam by:: [email or web address] CNM  FHT: baseline 140, moderate variability, pos accel, variables present, respond to position change.  Toco: q 1-3, strong, soft resting tone Pitocin @ 10 milli-units/hour   Results for orders placed or performed during the hospital encounter of 10/15/21 (from the past 24 hour(s))  CBC     Status: Abnormal   Collection Time: 10/15/21  1:12 PM  Result Value Ref Range   WBC 18.8 (H) 4.0 - 10.5 K/uL   RBC 4.24 3.87 - 5.11 MIL/uL   Hemoglobin 12.3 12.0 - 15.0 g/dL   HCT 12/15/21 50.9 - 32.6 %   MCV 86.3 80.0 - 100.0 fL   MCH 29.0 26.0 - 34.0 pg   MCHC 33.6 30.0 - 36.0 g/dL   RDW 71.2 45.8 - 09.9 %   Platelets 199 150 - 400 K/uL   nRBC 0.0 0.0 - 0.2 %  Type and screen [facility]     Status: None   Collection Time: 10/15/21  1:12 PM  Result Value Ref Range   ABO/RH(D) A POS    Antibody Screen NEG    Sample Expiration      10/18/2021,2359 Performed at [facility], [address] [place], [place]   Rapid HIV screen (HIV 1/2 Ab+Ag)     Status: None   Collection Time: 10/15/21  1:12 PM  Result Value Ref Range   HIV-1 P24 Antigen - HIV24 NON REACTIVE NON REACTIVE   HIV 1/2 Antibodies NON REACTIVE NON REACTIVE   Interpretation (HIV Ag Ab)      A non reactive test result means that HIV 1 or HIV 2 antibodies and HIV 1 p24 antigen were not detected in  the specimen.  ABO/Rh     Status: None   Collection Time: 10/15/21  2:43 PM  Result Value Ref Range   ABO/RH(D)      A POS Performed at [facility], [address] [place], [place]     Assessment:   36 y.o. G1P0000 [redacted]w[redacted]d in active labor  Plan:   1) Labor -active labor, VE at 2252 rim, Attempted to reduce cervix over 2 contractions, cervix stretchy but palpated fully around the fetal head. Pushing stopped.  Will increase pitocin and continue with position changes, reassess in 2 hours.  2) Fetus - category 2 tracing   3) ID: GBS negative, ruptured  since 1040, WBC 18.8 temp 99.1   4) Pain management: Epidural managed by Anesthesia  [redacted]w[redacted]d, CNM  Carie Caddy, [facility]  @TODAY @  1:15 AM

## 2021-10-17 NOTE — Lactation Note (Signed)
This note was copied from a baby's chart. Lactation Consultation Note  Patient Name: Girl Nyalee Elmquist [id number] Date: 10/17/2021 Reason for consult: Follow-up assessment;Primapara;Term;Other (Comment) (Feeding plan with use of nipple shield to improve latch and pumping initiated.) Age:36 hours  Maternal Data  This is mom's first baby, delivered vaginally. Feeding plan initiated yesterday by LC.Mom is using #16 mm nipple shield for baby with latch difficulties. Mom and baby not discharging to home today as baby has not voided since birth.   Has patient been taught Hand Expression?: Yes Does the patient have breastfeeding experience prior to this delivery?: No  Feeding Mother's Current Feeding Choice: Breast Milk Baby breastfed  with midwife's assistance prior to my returning to follow-up with mom. Mom would like to proceed to have baby offered donor milk by slow flow bottle. Care nurse will assist mom with bottle feeding and donor milk.  Lactation Tools Discussed/Used  #16 mm nipple shield   Interventions Interventions: Education (Donor milk)   Consult Status Consult Status: Follow-up Date: 10/17/21 Follow-up type: In-patient   Jonna Lakisa Lotz 10/17/2021, 12:33 PM

## 2021-10-17 NOTE — Progress Notes (Signed)
Post Partum Day 1 Subjective: up ad lib, voiding, tolerating PO, and she is somewhat tearful as her baby has not voided since delivery and  she was hoping for discharge this morning.  Objective: Blood pressure 107/83, pulse 90, temperature (!) 97.2 F (36.2 C), resp. rate 18, height 4\' 11"  (1.499 m), weight 64.4 kg, last menstrual period 01/14/2021, SpO2 99 %, unknown if currently breastfeeding.  Physical Exam:  General: alert, cooperative, fatigued, and mild distress Lochia: appropriate Uterine Fundus: firm Incision: healing well DVT Evaluation: No evidence of DVT seen on physical exam.  Recent Labs    10/16/21 0929 10/16/21 1655  HGB 10.8* 10.6*  HCT 31.8* 32.1*    Assessment/Plan: Plan for discharge tomorrow and Lactation consult today. Spent additional time at the bedside talking about her labor and birth experience. Assisted with positioning and latch of her newborn. Pediatrics also at bedside discussing additional testing for her baby today.   LOS: 2 days   12/16/21 10/17/2021, 10:52 AM

## 2021-10-17 NOTE — Lactation Note (Addendum)
This note was copied from a baby's chart. Lactation Consultation Note  Patient Name: Marie French Today's Date: 10/17/2021   Age:36 hours  Maternal Data See previous note from today.  Feeding Mother's Current Feeding Choice: Breast Milk and donor breastmilk On follow-up mom is supplementing baby with 20 ml's of donor milk after each breastfeeding attempt.  Interventions  Initiated mom pumping with double electric breastpump. Reviewed collection process of breastmilk. Mom will continue to offer any of her expressed breastmilk and the donor milk to supplement baby overnight. Mom will post pump after each breastfeeding/breastfeeding attempt. Mom will goal for at least 8 feeds in 24 hours.  Per mom's report baby has not voided as of yet. Update provided to care nurse.  Consult Status Consult Status: Follow-up Date: 10/18/21 Follow-up type: In-patient  Fuller Song 10/17/2021, 5:04 PM

## 2021-10-18 LAB — CBC WITH DIFFERENTIAL/PLATELET
Abs Immature Granulocytes: 0.1 10*3/uL — ABNORMAL HIGH (ref 0.00–0.07)
Basophils Absolute: 0.1 10*3/uL (ref 0.0–0.1)
Basophils Relative: 0 %
Eosinophils Absolute: 0.6 10*3/uL — ABNORMAL HIGH (ref 0.0–0.5)
Eosinophils Relative: 5 %
HCT: 29.1 % — ABNORMAL LOW (ref 36.0–46.0)
Hemoglobin: 9.7 g/dL — ABNORMAL LOW (ref 12.0–15.0)
Immature Granulocytes: 1 %
Lymphocytes Relative: 24 %
Lymphs Abs: 3 10*3/uL (ref 0.7–4.0)
MCH: 29.7 pg (ref 26.0–34.0)
MCHC: 33.3 g/dL (ref 30.0–36.0)
MCV: 89 fL (ref 80.0–100.0)
Monocytes Absolute: 0.8 10*3/uL (ref 0.1–1.0)
Monocytes Relative: 7 %
Neutro Abs: 7.9 10*3/uL — ABNORMAL HIGH (ref 1.7–7.7)
Neutrophils Relative %: 63 %
Platelets: 182 10*3/uL (ref 150–400)
RBC: 3.27 MIL/uL — ABNORMAL LOW (ref 3.87–5.11)
RDW: 13.2 % (ref 11.5–15.5)
WBC: 12.4 10*3/uL — ABNORMAL HIGH (ref 4.0–10.5)
nRBC: 0 % (ref 0.0–0.2)

## 2021-10-18 NOTE — Progress Notes (Signed)
Patient discharged home with family.  Discharge instructions, when to follow up, and prescriptions reviewed with patient.  Patient verbalized understanding. Patient will be escorted out by auxiliary.   

## 2021-10-19 ENCOUNTER — Encounter: Payer: BC Managed Care – PPO | Admitting: Obstetrics

## 2021-10-19 ENCOUNTER — Other Ambulatory Visit: Payer: BC Managed Care – PPO

## 2021-10-19 ENCOUNTER — Telehealth: Payer: Self-pay

## 2021-10-19 NOTE — Telephone Encounter (Signed)
Patient called in looking for her FMLA paperwork as she has already delivered, nothing in her chart for her FMLA paperwork.

## 2021-10-20 NOTE — Telephone Encounter (Signed)
Left msg for pt to call me back

## 2021-10-21 NOTE — Telephone Encounter (Signed)
Called pt to get # for Loletta Parish and her claim number [id number]; [id number]# [id number]).  Called [place]; spoke c Mel Almond who states they have the FMLA paperwork they need and it's going to the specialists for review.  Pt aware.

## 2021-10-29 ENCOUNTER — Ambulatory Visit (INDEPENDENT_AMBULATORY_CARE_PROVIDER_SITE_OTHER): Payer: BC Managed Care – PPO | Admitting: Licensed Practical Nurse

## 2021-10-29 DIAGNOSIS — Z30011 Encounter for initial prescription of contraceptive pills: Secondary | ICD-10-CM

## 2021-10-29 MED ORDER — NORETHINDRONE 0.35 MG PO TABS: 1.0000 | ORAL_TABLET | Freq: Every day | ORAL | 11 refills | Status: DC

## 2021-10-29 NOTE — Progress Notes (Signed)
Postpartum Visit  Chief Complaint:  Chief Complaint  Patient presents with   Postpartum Care    History of Present Illness: Patient is a 36 y.o. G1P1001 presents for postpartum visit.  Date of delivery: 10/16/2021 Type of delivery: Vaginal delivery - Vacuum or forceps assisted  NO Episiotomy No. no Laceration: yes  Pregnancy or labor problems:  maternal fever  Any problems since the delivery:  constipation Sleep: longest stretch 4 hours, naps during the day Her partner worked for a 24 hour shift yesterday  that was a rough day for her but she managed.  Desires POP for contraception, undecided about future pregnancies   Newborn Details:  SINGLETON :  1. Baby's name: Zollie Scale . Birth weight: 2660 grams Maternal Details:  Breast Feeding:  yes, going to breast and pumping  Post partum depression/anxiety noted:  no Edinburgh Post-Partum Depression Score:  3  Date of last PAP: 2022  normal   Past Medical History:  Diagnosis Date   Hypertrophy of tonsils     Past Surgical History:  Procedure Laterality Date   MOUTH SURGERY     gum graft. conscious sedation   TONSILLECTOMY AND ADENOIDECTOMY N/A 12/13/2016   Procedure: TONSILLECTOMY;  Surgeon: Geanie Logan, MD;  [facility];  Service: ENT;  Laterality: N/A;    Prior to Admission medications   Medication Sig Start Date End Date Taking? Authorizing Provider  Bacillus Coagulans-Inulin (ALIGN PREBIOTIC-PROBIOTIC PO)  07/14/21  Yes [provider]  Prenatal Vit-Fe Fumarate-FA (MULTIVITAMIN-PRENATAL) 27-0.8 MG TABS tablet Take 1 tablet by mouth daily at 12 noon.   Yes [provider]    Allergies  Allergen Reactions   Penicillins Anaphylaxis     Social History   Socioeconomic History   Marital status: Married    Spouse name: Not on file   Number of children: Not on file   Years of education: Not on file   Highest education level: Not on file  Occupational History   Not on file  Tobacco  Use   Smoking status: Former    Types: Cigarettes    Quit date: 06/17/2015    Years since quitting: 6.3   Smokeless tobacco: Never  Vaping Use   Vaping Use: Never used  Substance and Sexual Activity   Alcohol use: Not Currently    Alcohol/week: 3.0 standard drinks of alcohol    Types: 3 Glasses of wine per week   Drug use: Never   Sexual activity: Yes    Birth control/protection: Pill  Other Topics Concern   Not on file  Social History Narrative   Not on file   Social Determinants of Health   Financial Resource Strain: Not on file  Food Insecurity: Not on file  Transportation Needs: Not on file  Physical Activity: Not on file  Stress: Not on file  Social Connections: Not on file  Intimate Partner Violence: Not on file    Family History  Problem Relation Age of Onset   Breast cancer Maternal Grandmother    Diabetes Father    Hypertension Mother    Diabetes Sister    Hypertension Sister    Hypercholesterolemia Sister    Ovarian cancer Neg Hx    Colon cancer Neg Hx     Review of Systems  Constitutional: Negative.   Respiratory: Negative.    Cardiovascular: Negative.   Gastrointestinal: Negative.   Genitourinary:        Vaginal bleeding lighter than period red-brown   Musculoskeletal: Negative.   Neurological:  Negative.   Psychiatric/Behavioral: Negative.       Physical Exam BP 120/70   Ht 4\' 11"  (1.499 m)   Wt 132 lb (59.9 kg)   Breastfeeding Yes   BMI 26.66 kg/m   Physical Exam Constitutional:      Appearance: Normal appearance.  Genitourinary:     Genitourinary Comments: Declines exam   Pulmonary:     Effort: Pulmonary effort is normal.  Chest:     Comments: Breasts: no redness or masses, nipple erect and intact bilaterally  Abdominal:     General: Abdomen is flat. There is no distension.     Tenderness: There is no abdominal tenderness.     Comments: Uterus not palpable in abd  Musculoskeletal:        General: No swelling.     Cervical back:  Normal range of motion and neck supple.  Neurological:     General: No focal deficit present.     Mental Status: She is alert.  Skin:    General: Skin is warm and dry.  Psychiatric:        Mood and Affect: Mood normal.     Assessment: 36 y.o. G1P1001 presenting for 2 week postpartum visit  Plan: Problem List Items Addressed This Visit   None Visit Diagnoses     Care and examination of lactating mother    -  Primary   Relevant Medications   norethindrone (ORTHO MICRONOR) 0.35 MG tablet   Encounter for initial prescription of contraceptive pills       Relevant Medications   norethindrone (ORTHO MICRONOR) 0.35 MG tablet        1) Contraception Education given regarding options for contraception, including oral contraceptives. Script sent, to be started after 2 weeks, when pt desires   2)  Pap - ASCCP guidelines and rational discussed.  Due in 2025  3) Patient underwent screening for postpartum depression with NO concerns noted.  4) Follow up 4 wks for PP exam   2026, CNM  Carie Caddy, [facility]  10/29/21  5:22 PM

## 2021-11-09 ENCOUNTER — Encounter: Payer: Self-pay | Admitting: Licensed Practical Nurse

## 2021-11-26 ENCOUNTER — Encounter: Payer: Self-pay | Admitting: Licensed Practical Nurse

## 2021-11-26 ENCOUNTER — Ambulatory Visit (INDEPENDENT_AMBULATORY_CARE_PROVIDER_SITE_OTHER): Payer: BC Managed Care – PPO | Admitting: Licensed Practical Nurse

## 2021-11-26 DIAGNOSIS — Z862 Personal history of diseases of the blood and blood-forming organs and certain disorders involving the immune mechanism: Secondary | ICD-10-CM

## 2021-11-27 LAB — CBC
Hematocrit: 35.9 % (ref 34.0–46.6)
Hemoglobin: 12.1 g/dL (ref 11.1–15.9)
MCH: 30 pg (ref 26.6–33.0)
MCHC: 33.7 g/dL (ref 31.5–35.7)
MCV: 89 fL (ref 79–97)
Platelets: 319 10*3/uL (ref 150–450)
RBC: 4.04 x10E6/uL (ref 3.77–5.28)
RDW: 12.3 % (ref 11.7–15.4)
WBC: 7.6 10*3/uL (ref 3.4–10.8)

## 2021-11-29 NOTE — Progress Notes (Signed)
Postpartum Visit  Chief Complaint:  Chief Complaint  Patient presents with   Postpartum Care    6 wk, concerned about stitches    History of Present Illness: Patient is a 36 y.o. G1P1001 presents for postpartum visit.  Date of delivery: 10/16/2021 Type of delivery: Vaginal delivery - Vacuum or forceps assisted  NO Episiotomy No. no Laceration: yes  Pregnancy or labor problems:  maternal fever  Any problems since the delivery:  No Sleep: longest stretch 5 hours, naps during the day Returns to work around labor day, her partner is currently on 4 wks paternity leave Mood has been good, no concerns Started POP's , has not had IC yet Does not hav  Newborn Details:  SINGLETON :  1. Baby's name: Zollie Scale . Birth weight: 2660 grams Maternal Details:  Breast Feeding:  yes, going to breast and pumping  Post partum depression/anxiety noted:  no Edinburgh Post-Partum Depression Score:  3  Date of last PAP: 2022  normal    Past Medical History:  Diagnosis Date   Hypertrophy of tonsils     Past Surgical History:  Procedure Laterality Date   MOUTH SURGERY     gum graft. conscious sedation   TONSILLECTOMY AND ADENOIDECTOMY N/A 12/13/2016   Procedure: TONSILLECTOMY;  Surgeon: Geanie Logan, MD;  Location: [facility];  Service: ENT;  Laterality: N/A;    Prior to Admission medications   Medication Sig Start Date End Date Taking? Authorizing Provider  norethindrone (ORTHO MICRONOR) 0.35 MG tablet Take 1 tablet (0.35 mg total) by mouth daily. 10/29/21  Yes Fe Okubo, Courtney Heys, CNM  Prenatal Vit-Fe Fumarate-FA (MULTIVITAMIN-PRENATAL) 27-0.8 MG TABS tablet Take 1 tablet by mouth daily at 12 noon.   Yes [provider]    Allergies  Allergen Reactions   Penicillins Anaphylaxis     Social History   Socioeconomic History   Marital status: Married    Spouse name: Not on file   Number of children: Not on file   Years of education: Not on file   Highest education  level: Not on file  Occupational History   Not on file  Tobacco Use   Smoking status: Former    Types: Cigarettes    Quit date: 06/17/2015    Years since quitting: 6.4   Smokeless tobacco: Never  Vaping Use   Vaping Use: Never used  Substance and Sexual Activity   Alcohol use: Not Currently    Alcohol/week: 3.0 standard drinks of alcohol    Types: 3 Glasses of wine per week   Drug use: Never   Sexual activity: Not Currently    Birth control/protection: Pill  Other Topics Concern   Not on file  Social History Narrative   Not on file   Social Determinants of Health   Financial Resource Strain: Not on file  Food Insecurity: Not on file  Transportation Needs: Not on file  Physical Activity: Not on file  Stress: Not on file  Social Connections: Not on file  Intimate Partner Violence: Not on file    Family History  Problem Relation Age of Onset   Hypertension Mother    Diabetes Father    Diabetes Sister    Hypertension Sister    Hypercholesterolemia Sister    Breast cancer Maternal Grandmother        30/40s   Ovarian cancer Neg Hx    Colon cancer Neg Hx     Review of Systems  Constitutional: Negative.   Respiratory: Negative.  Cardiovascular: Negative.   Gastrointestinal: Negative.   Genitourinary: Negative.   Musculoskeletal: Negative.   Neurological: Negative.   Psychiatric/Behavioral: Negative.       Physical Exam BP 90/60   Ht 4\' 11"  (1.499 m)   Wt 128 lb (58.1 kg)   Breastfeeding Yes   BMI 25.85 kg/m   Physical Exam Constitutional:      Appearance: Normal appearance.  Genitourinary:     Vulva normal.     Genitourinary Comments: Bimanual exam: uterus non gravid, non tender, adnexa non tender Remnant of stitch removed from left labia, laceration well healed   Cardiovascular:     Rate and Rhythm: Normal rate and regular rhythm.     Pulses: Normal pulses.     Heart sounds: Normal heart sounds.  Pulmonary:     Effort: Pulmonary effort is normal.      Breath sounds: Normal breath sounds.  Abdominal:     General: Abdomen is flat.     Tenderness: There is no abdominal tenderness.  Musculoskeletal:     Cervical back: Normal range of motion.  Neurological:     General: No focal deficit present.     Mental Status: She is alert.  Psychiatric:        Mood and Affect: Mood normal.      Female Chaperone present during breast and/or pelvic exam.  Assessment: 36 y.o. G1P1001 presenting for 6 week postpartum visit  Plan: Problem List Items Addressed This Visit   None Visit Diagnoses     Care and examination of lactating mother    -  Primary   Relevant Orders   CBC (Completed)   History of anemia       Relevant Orders   CBC (Completed)        1) Contraception started POP's.  2)  Pap - ASCCP guidelines and rational discussed.  Patient opts for 3 year screening interval Due in 2025  3) Patient underwent screening for postpartum depression with NO  concerns noted.  4) Follow up 1 year for routine annual exam  2026, CNM  Carie Caddy [facility]  11/29/21  6:50 PM

## 2022-03-08 IMAGING — US US OB COMP +14 WK
1 series · 15 of 28 positions shown · non-contrast
Comparison: none

CLINICAL DATA: Fetal anatomy evaluation

EXAM:
OBSTETRICAL ULTRASOUND >14 WKS

[Series 1: us ob comp + 14 wk · 15 of 117 slices shown]
[im 1/117]
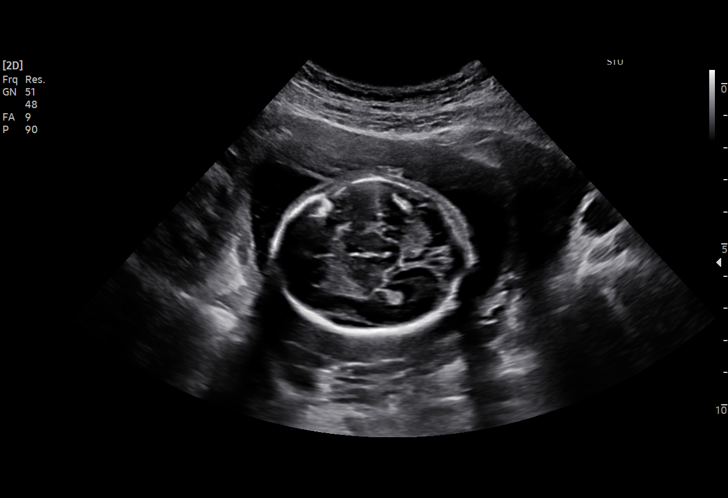
[im 9/117]
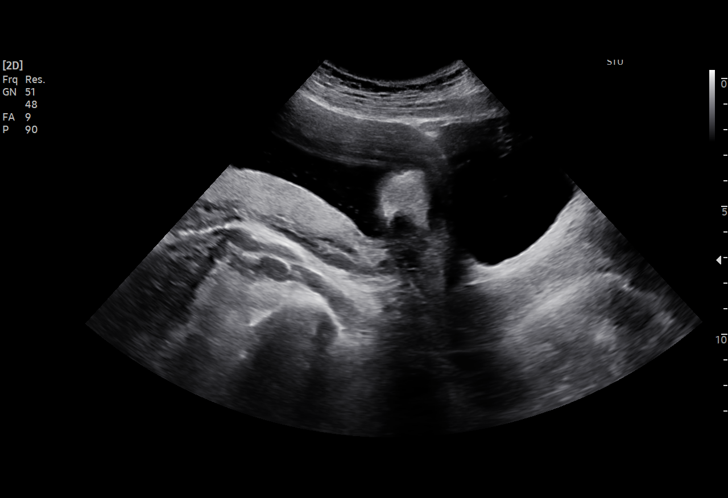
[im 18/117]
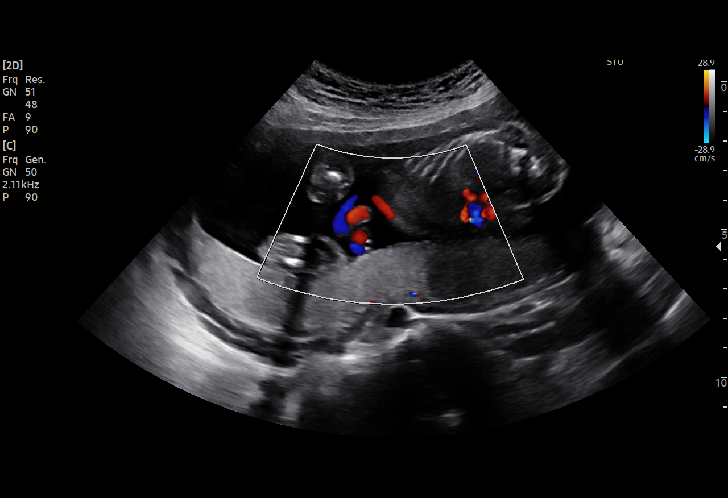
[im 26/117]
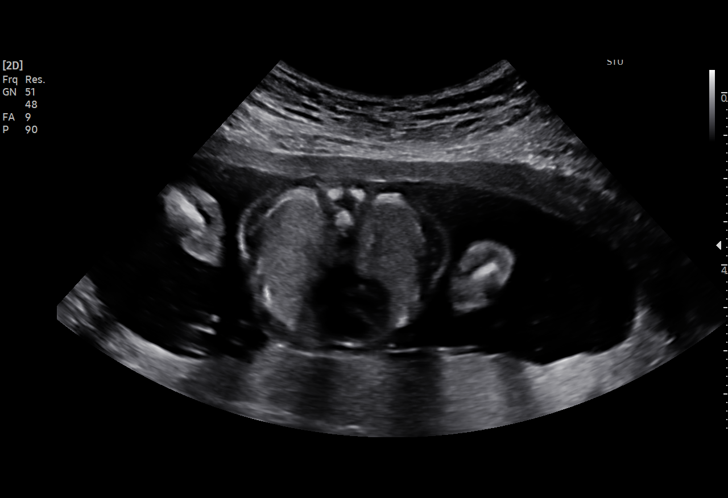
[im 35/117]
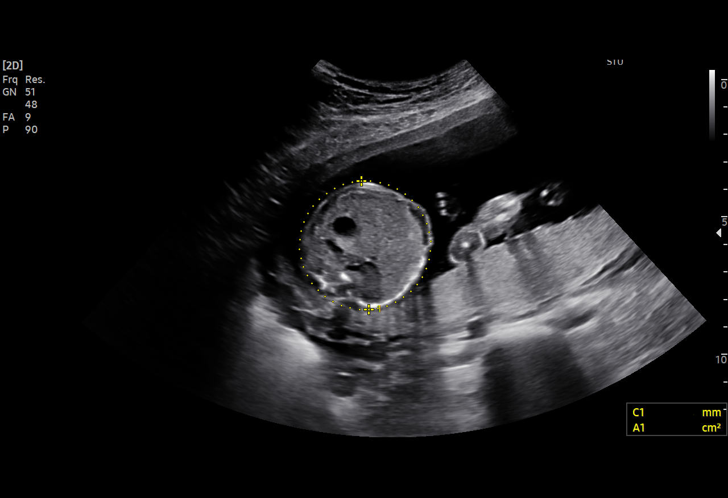
[im 43/117]
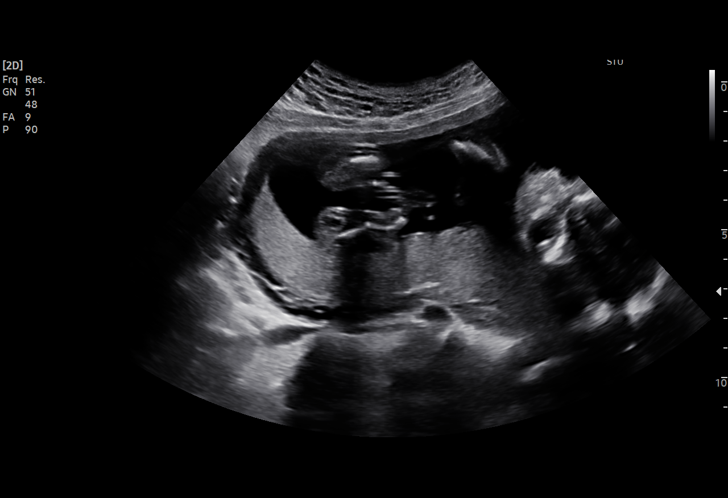
[im 52/117]
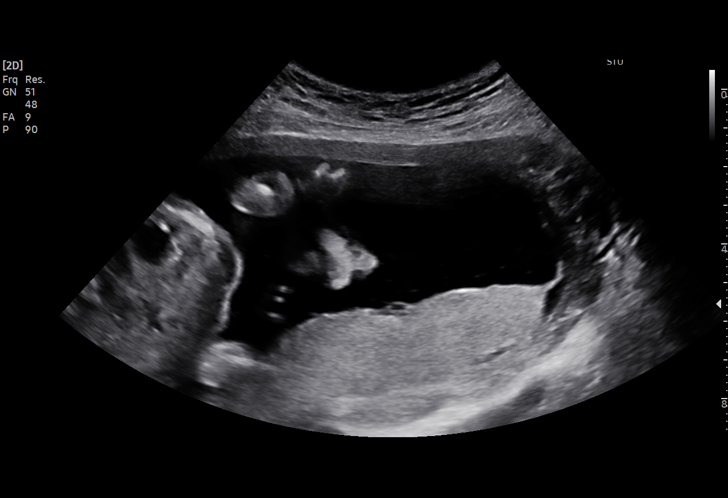
[im 61/117]
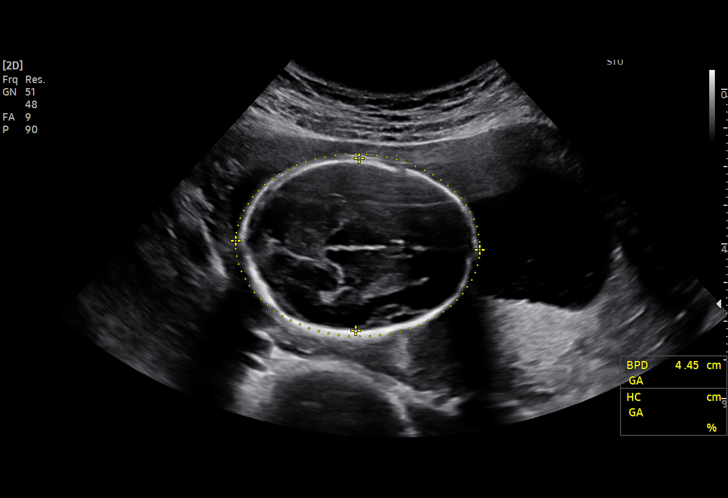
[im 65/117]
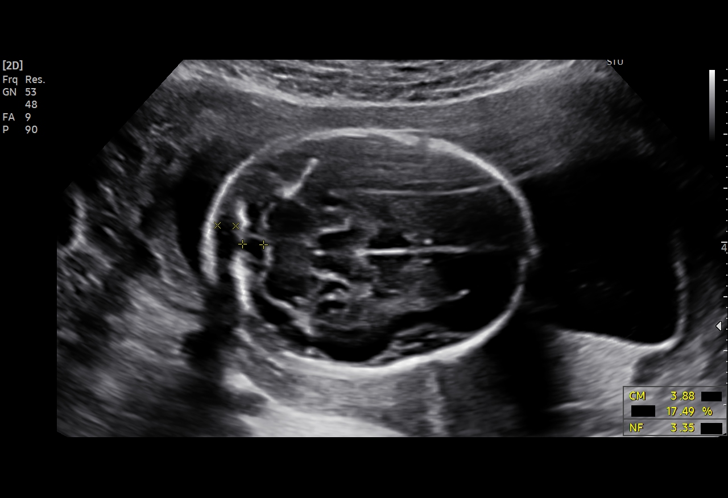
[im 74/117]
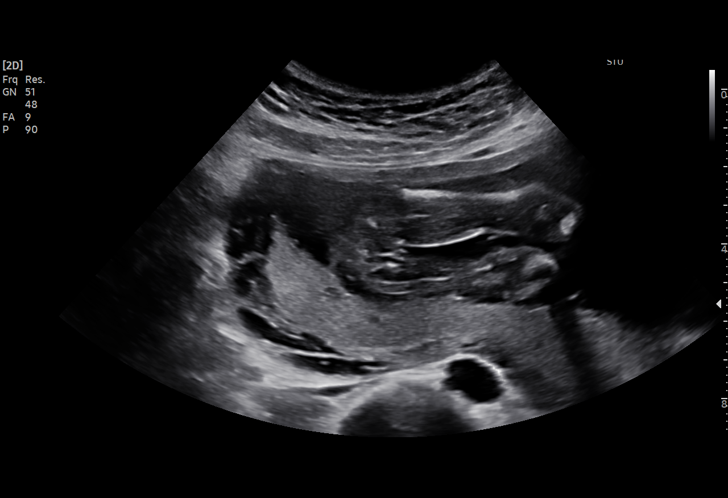
[im 82/117]
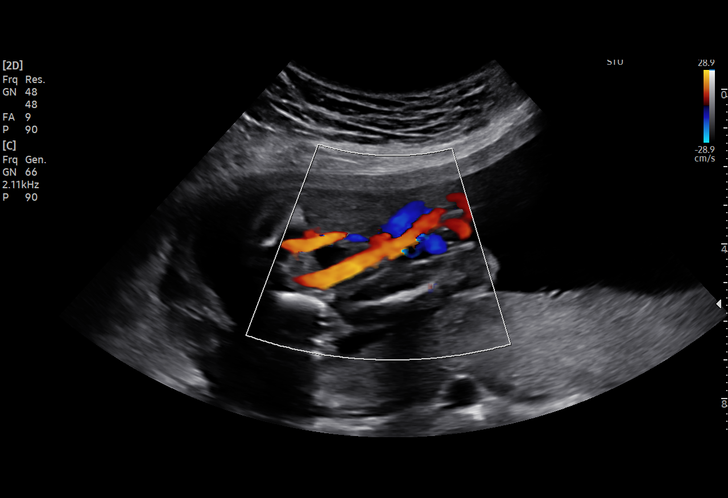
[im 91/117]
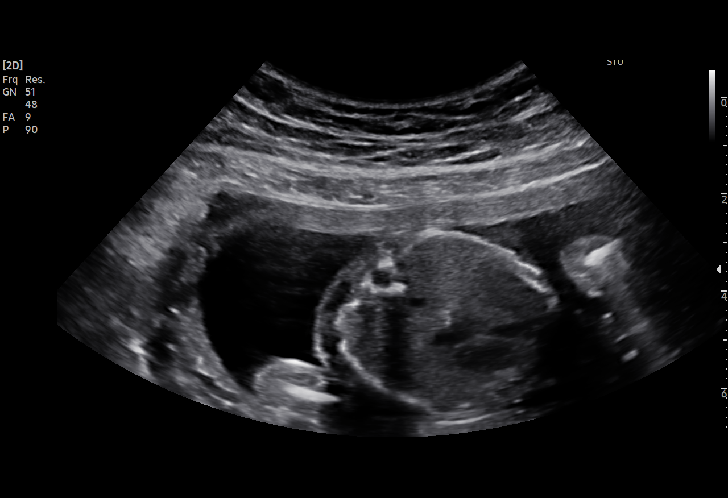
[im 99/117]
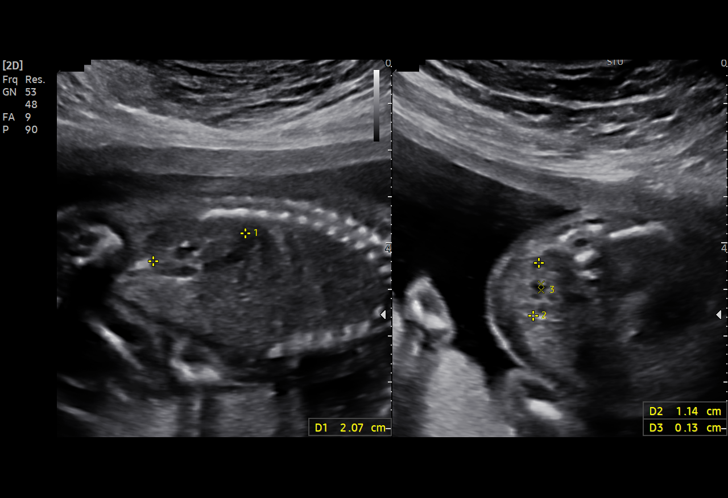
[im 108/117]
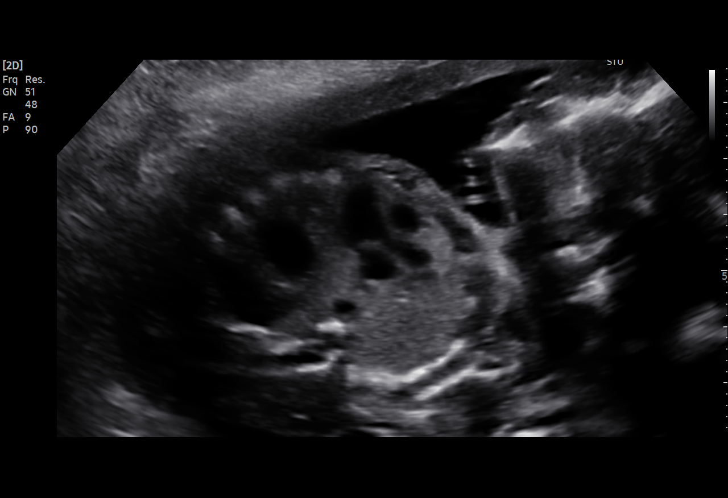
[im 117/117]
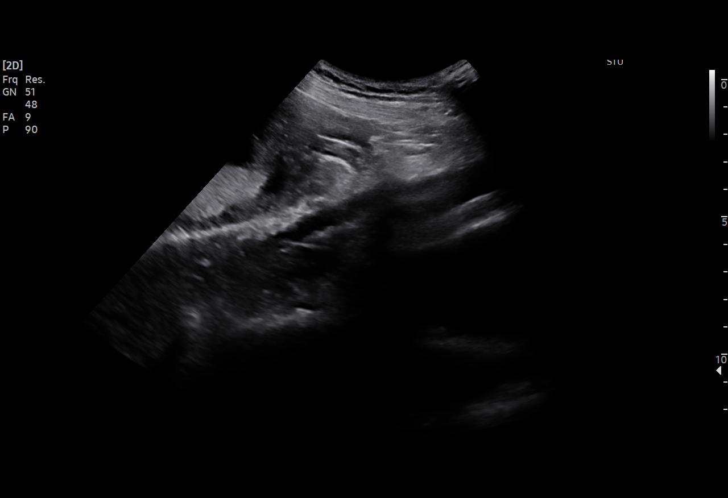

[15 of 28 positions shown; findings below may reference images not displayed]

FINDINGS: Number of Fetuses: 1

Heart Rate:  144 bpm

Movement: Yes

Presentation: Cephalic

Previa: No

Placental Location: Posterior

Amniotic Fluid (Subjective): Normal

Amniotic Fluid (Objective):

Vertical pocket = 3.8cm

FETAL BIOMETRY

BPD: 4.48cm 19w 4d

HC:   17.38cm 19w 6d

AC:   15.26cm 20w 3d

FL:   3.06cm 19w 3d

Current Mean GA: 19w 9d US EDC: 10/22/2021

Assigned GA:  19w 6d Assigned EDC: 10/21/2021

FETAL ANATOMY

Lateral Ventricles: Appears normal

Thalami/CSP: Appears normal

Posterior Fossa:  Appears normal

Nuchal Region: Appears normal   NFT= 3 mm

Upper Lip: Appears normal

Spine: Appears normal

4 Chamber Heart on Left: Appears normal

LVOT: Appears normal

RVOT: Appears normal

Stomach on Left: Appears normal

3 Vessel Cord: Appears normal

Cord Insertion site: Appears normal

Kidneys: Appears normal

Bladder: Appears normal

Extremities: Appears normal

Technically difficult due to: None

Maternal Findings:

Cervix:  Cervical length 3.2 cm.  Closed.
IMPRESSION: 1. Single live intrauterine pregnancy as detailed above.

## 2022-03-14 ENCOUNTER — Telehealth: Payer: Self-pay

## 2022-03-14 ENCOUNTER — Other Ambulatory Visit: Payer: Self-pay

## 2022-03-14 DIAGNOSIS — N61 Mastitis without abscess: Secondary | ICD-10-CM

## 2022-03-14 MED ORDER — CLINDAMYCIN HCL 300 MG PO CAPS: 300.0000 mg | ORAL_CAPSULE | Freq: Four times a day (QID) | ORAL | 0 refills | Status: AC

## 2022-03-17 NOTE — Telephone Encounter (Signed)
Patient was sent medication verbalized understanding.

## 2022-06-03 ENCOUNTER — Ambulatory Visit: Payer: BC Managed Care – PPO | Admitting: Advanced Practice Midwife

## 2022-06-03 ENCOUNTER — Encounter: Payer: Self-pay | Admitting: Advanced Practice Midwife

## 2022-06-03 VITALS — BP 113/79 | HR 66 | Resp 15 | Wt 121.9 lb

## 2022-06-03 DIAGNOSIS — Z30014 Encounter for initial prescription of intrauterine contraceptive device: Secondary | ICD-10-CM

## 2022-06-03 DIAGNOSIS — Z30430 Encounter for insertion of intrauterine contraceptive device: Secondary | ICD-10-CM

## 2022-06-03 DIAGNOSIS — Z3202 Encounter for pregnancy test, result negative: Secondary | ICD-10-CM

## 2022-06-03 LAB — POCT URINE PREGNANCY: Preg Test, Ur: NEGATIVE

## 2022-06-03 MED ORDER — LEVONORGESTREL 20 MCG/DAY IU IUD
1.0000 | INTRAUTERINE_SYSTEM | Freq: Once | INTRAUTERINE | Status: AC
  Administered 2022-06-03: 1 via INTRAUTERINE

## 2022-06-03 NOTE — Progress Notes (Signed)
Marie French  Marie French is a 37 y.o. G1P1001 here for Mirena IUD insertion. No GYN concerns.  Last pap smear was on 03/09/21 and was normal.  The patient is currently using progesterone only pills for contraception and her LMP is No LMP recorded (within months). Recent pregnancy/has been breastfeeding.  The indication for her IUD is contraception/cycle control.  Review of Systems  Constitutional:  Negative for chills and fever.  HENT:  Negative for congestion, ear discharge, ear pain, hearing loss, sinus pain and sore throat.   Eyes:  Negative for blurred vision and double vision.  Respiratory:  Negative for cough, shortness of breath and wheezing.   Cardiovascular:  Negative for chest pain, palpitations and leg swelling.  Gastrointestinal:  Negative for abdominal pain, blood in stool, constipation, diarrhea, heartburn, melena, nausea and vomiting.  Genitourinary:  Negative for dysuria, flank pain, frequency, hematuria and urgency.  Musculoskeletal:  Negative for back pain, joint pain and myalgias.  Skin:  Negative for itching and rash.  Neurological:  Negative for dizziness, tingling, tremors, sensory change, speech change, focal weakness, seizures, loss of consciousness, weakness and headaches.  Endo/Heme/Allergies:  Negative for environmental allergies. Does not bruise/bleed easily.  Psychiatric/Behavioral:  Negative for depression, hallucinations, memory loss, substance abuse and suicidal ideas. The patient is not nervous/anxious and does not have insomnia.    Vital Signs: BP 113/79   Pulse 66   Resp 15   Wt 121 lb 14.4 oz (55.3 kg)   LMP  (Within Months) Comment: patient reports spotting.  Breastfeeding Yes   BMI 24.62 kg/m  Constitutional: Well nourished, well developed female in no acute distress.  HEENT: normal Skin: Warm and dry.  Cardiovascular: Regular rate and rhythm.   Extremity:  no edema   Respiratory: Clear to  auscultation bilateral. Normal respiratory effort Psych: Alert and Oriented x3. No memory deficits. Normal mood and affect.    Pelvic exam: (female chaperone present) is not limited by body habitus EGBUS: within normal limits Vagina: within normal limits and with normal mucosa  Cervix: normal appearance   IUD Insertion Procedure French Patient identified, informed consent performed, consent signed.   Discussed risks of irregular bleeding, cramping, infection, malpositioning, expulsion or uterine perforation of the IUD (1:1000 placements)  which may require further procedure such as laparoscopy.  IUD while effective at preventing pregnancy do not prevent transmission of sexually transmitted diseases and use of barrier methods for this purpose was discussed. Time out was performed.  Urine pregnancy test negative.  Speculum placed in the vagina.  Cervix visualized.  Cleaned with Betadine x 2.  Grasped anteriorly with a single tooth tenaculum.  Uterus sounded to 7 cm. IUD placed per manufacturer's recommendations.  Strings trimmed to 2-3 cm. Tenaculum was removed, good hemostasis noted.  Patient tolerated procedure well.   Patient was given post-procedure instructions.  She was advised to have backup contraception for one week.  Patient was also asked to check IUD strings periodically and follow up in 4-6 weeks for IUD check.  IUD insertion CPT 58300,  Skyla J7301 Mirena J7298 Iron ZOXWRUEAV W0981 Verdia Kuba X9147 Modifer 25, plus Modifer 79 is done during a global billing visit    Rod Can, [facility]  06/03/2022, 12:48 PM

## 2022-06-06 ENCOUNTER — Encounter: Payer: Self-pay | Admitting: Advanced Practice Midwife

## 2022-06-30 ENCOUNTER — Ambulatory Visit: Payer: BC Managed Care – PPO | Admitting: Advanced Practice Midwife

## 2022-07-05 ENCOUNTER — Ambulatory Visit: Payer: BC Managed Care – PPO | Admitting: Advanced Practice Midwife

## 2022-07-28 ENCOUNTER — Encounter: Payer: Self-pay | Admitting: Advanced Practice Midwife

## 2022-07-28 ENCOUNTER — Ambulatory Visit: Payer: BC Managed Care – PPO | Admitting: Advanced Practice Midwife

## 2022-07-28 VITALS — BP 120/76 | HR 85 | Ht 59.0 in | Wt 120.0 lb

## 2022-07-28 DIAGNOSIS — Z30431 Encounter for routine checking of intrauterine contraceptive device: Secondary | ICD-10-CM | POA: Diagnosis not present

## 2022-07-28 NOTE — Progress Notes (Signed)
[facility]  IUD String Check  Subjctive: [name] presents for IUD string check.  She had a Mirena placed 6 weeks ago.  Since placement of her IUD she had occasional spotting vaginal bleeding.  She denies cramping or discomfort.  She has had intercourse since placement.  She has not checked the strings.  She denies any fever, chills, nausea, vomiting, or other complaints.    Review of Systems  Constitutional:  Negative for chills and fever.  HENT:  Negative for congestion, ear discharge, ear pain, hearing loss, sinus pain and sore throat.   Eyes:  Negative for blurred vision and double vision.  Respiratory:  Negative for cough, shortness of breath and wheezing.   Cardiovascular:  Negative for chest pain, palpitations and leg swelling.  Gastrointestinal:  Negative for abdominal pain, blood in stool, constipation, diarrhea, heartburn, melena, nausea and vomiting.  Genitourinary:  Negative for dysuria, flank pain, frequency, hematuria and urgency.  Musculoskeletal:  Negative for back pain, joint pain and myalgias.  Skin:  Negative for itching and rash.  Neurological:  Negative for dizziness, tingling, tremors, sensory change, speech change, focal weakness, seizures, loss of consciousness, weakness and headaches.  Endo/Heme/Allergies:  Negative for environmental allergies. Does not bruise/bleed easily.  Psychiatric/Behavioral:  Negative for depression, hallucinations, memory loss, substance abuse and suicidal ideas. The patient is not nervous/anxious and does not have insomnia.      Objective: BP 120/76   Pulse 85   Ht '4\' 11"'$  (1.499 m)   Wt 120 lb (54.4 kg)   LMP  (LMP Unknown) Comment: Mirena only spots  Breastfeeding No   BMI 24.24 kg/m  Gen:  NAD, A&Ox3 HEENT: normocephalic, anicteric Pulmonary: no increased work of breathing Pelvic: Normal appearing external female genitalia, normal vaginal epithelium, no abnormal discharge. Normal appearing cervix.  IUD  strings visible and 2 cm in length slightly shorter than at the time of placement. Psychiatric: mood appropriate, affect full Neurologic: grossly normal   Assessment: 37 y.o. year old female status post prior Mirena IUD placement 6 weeks ago, doing well.  Plan: 1.  The patient was given instructions to check her IUD strings monthly and call with any problems or concerns.  She should call for fevers, chills, abnormal vaginal discharge, pelvic pain, or other complaints. 2.  She will return for a annual exam in 1 year.  All questions answered.   Rod Can, CNM 07/28/2022 9:10 AM
